# Patient Record
Sex: Male | Born: 1960
Health system: Southern US, Community
[De-identification: ages and names within clinical notes are randomized; demographics above are authoritative.]

## PROBLEM LIST (undated history)

## (undated) DIAGNOSIS — M19011 Primary osteoarthritis, right shoulder: Secondary | ICD-10-CM

## (undated) DIAGNOSIS — E785 Hyperlipidemia, unspecified: Secondary | ICD-10-CM

## (undated) DIAGNOSIS — R7303 Prediabetes: Secondary | ICD-10-CM

## (undated) DIAGNOSIS — F1010 Alcohol abuse, uncomplicated: Secondary | ICD-10-CM

## (undated) DIAGNOSIS — D509 Iron deficiency anemia, unspecified: Secondary | ICD-10-CM

## (undated) DIAGNOSIS — J189 Pneumonia, unspecified organism: Secondary | ICD-10-CM

## (undated) DIAGNOSIS — F4310 Post-traumatic stress disorder, unspecified: Secondary | ICD-10-CM

## (undated) DIAGNOSIS — K219 Gastro-esophageal reflux disease without esophagitis: Secondary | ICD-10-CM

## (undated) DIAGNOSIS — G4733 Obstructive sleep apnea (adult) (pediatric): Secondary | ICD-10-CM

## (undated) DIAGNOSIS — I10 Essential (primary) hypertension: Secondary | ICD-10-CM

## (undated) HISTORY — PX: APPENDECTOMY: SHX54

## (undated) HISTORY — PX: KNEE SURGERY: SHX244

## (undated) HISTORY — DX: Iron deficiency anemia, unspecified: D50.9

## (undated) HISTORY — DX: Obstructive sleep apnea (adult) (pediatric): G47.33

## (undated) HISTORY — DX: Alcohol abuse, uncomplicated: F10.10

## (undated) HISTORY — DX: Hyperlipidemia, unspecified: E78.5

## (undated) HISTORY — DX: Pneumonia, unspecified organism: J18.9

## (undated) HISTORY — DX: Primary osteoarthritis, right shoulder: M19.011

## (undated) HISTORY — DX: Prediabetes: R73.03

## (undated) HISTORY — PX: MENISCUS REPAIR: SHX5179

## (undated) HISTORY — DX: Post-traumatic stress disorder, unspecified: F43.10

---

## 2014-10-11 ENCOUNTER — Encounter: Payer: Self-pay | Admitting: *Deleted

## 2014-10-11 ENCOUNTER — Ambulatory Visit: Payer: BLUE CROSS/BLUE SHIELD

## 2014-10-11 ENCOUNTER — Ambulatory Visit
Admission: EM | Admit: 2014-10-11 | Discharge: 2014-10-11 | Disposition: A | Discharge status: Home / Self Care | Payer: BLUE CROSS/BLUE SHIELD | Attending: Family Medicine | Admitting: Family Medicine

## 2014-10-11 DIAGNOSIS — S62522A Displaced fracture of distal phalanx of left thumb, initial encounter for closed fracture: Secondary | ICD-10-CM | POA: Diagnosis not present

## 2014-10-11 HISTORY — DX: Gastro-esophageal reflux disease without esophagitis: K21.9

## 2014-10-11 HISTORY — DX: Essential (primary) hypertension: I10

## 2014-10-11 NOTE — ED Provider Notes (Signed)
CSN: [id number]     Arrival date & time 10/11/14  0825 History   First MD Initiated Contact with Patient 10/11/14 [phone]     Chief Complaint  Patient presents with  . Finger Injury    thumb on left hand   (Consider location/radiation/quality/duration/timing/severity/associated sxs/prior Treatment) HPI Comments: 54 yo male with left thumb pain after hitting his thumb by accident with a 2 lb hammer yesterday. Had some bleeding from the edges of the nail, but no lacerations or breaks in the skin.   The history is provided by the patient.    Past Medical History  Diagnosis Date  . Hypertension   . GERD (gastroesophageal reflux disease)    Past Surgical History  Procedure Laterality Date  . Appendectomy    . Knee surgery Bilateral    Family History  Problem Relation Age of Onset  . Diabetes Mother    Social History  Substance Use Topics  . Smoking status: Never Smoker   . Smokeless tobacco: Never Used  . Alcohol Use: Yes    Review of Systems  Allergies  Review of patient's allergies indicates no known allergies.  Home Medications   Prior to Admission medications   Medication Sig Start Date End Date Taking? Authorizing Provider  enalapril (VASOTEC) 10 MG tablet Take 10 mg by mouth daily.   Yes Historical Provider, MD  hydrochlorothiazide (HYDRODIURIL) 25 MG tablet Take 25 mg by mouth daily.   Yes Historical Provider, MD  omeprazole (PRILOSEC) 40 MG capsule Take 40 mg by mouth daily.   Yes Historical Provider, MD   Meds Ordered and Administered this Visit  Medications - No data to display  BP 154/107 mmHg  Temp(Src) 98.3 F (36.8 C) (Oral)  Resp 18  Ht  (1.753 m)  Wt 210 lb (95.255 kg)  BMI 31.00 kg/m2  SpO2 97% No data found.   Physical Exam  Constitutional: He appears well-developed and well-nourished. No distress.  Musculoskeletal:       Left hand: He exhibits tenderness and swelling. He exhibits normal two-point discrimination, normal capillary refill  and no deformity. Normal sensation noted. Normal strength noted.  Left thumb swelling and  tenderness to palpation over distal phalanx area; mild amount of blood present at the edges of the nail  Neurological: He is alert.  Skin: He is not diaphoretic.  Nursing note and vitals reviewed.   ED Course  Procedures (including critical care time)  Labs Review Labs Reviewed - No data to display  Imaging Review Dg Finger Thumb Left  10/11/2014   CLINICAL DATA:  Patient hit thumb with hammer  EXAM: LEFT THUMB 2+V  COMPARISON:  None.  FINDINGS: Frontal, oblique, and lateral views were obtained. There is a transversely oriented fracture of the distal aspect of the first distal phalanx with slight displacement of fracture fragments. No other fractures. No dislocation. Joint spaces appear intact. No erosive change.  IMPRESSION: Transversely oriented fracture distal aspect first distal phalanx with mild displacement of fracture fragments.   Electronically Signed   By: Bretta Bang III M.D.   On: 10/11/2014 09:34     Visual Acuity Review  Right Eye Distance:   Left Eye Distance:   Bilateral Distance:    Right Eye Near:   Left Eye Near:    Bilateral Near:         MDM   1. Fracture of distal phalanx of left thumb, closed, initial encounter    Plan: 1. x-ray results and diagnosis reviewed with  patient/parent/guardian/caregiver 2. rx as per orders above;  benefits, risks, potential side effects reviewed; patient given handwritten rx for vicodin 3/325, 1-2 tabs po q 8hrs prn, #10 3. Thumb splint applied for immobilization 4. Recommend supportive treatment with elevation of hand, otc analgesics/NSAIDS prn 5. Follow up with hand orthopedist specialist in 1-2 days for further evaluation and management 6. F/U here prn   Payton Mccallum, MD 10/11/14 1034

## 2014-10-11 NOTE — ED Notes (Signed)
Patient hit his left thumb with a 2 lb hammer yesterday at 1300. The nail is intact with bleeding coming from around the edges of the nail. No breaks in the skin visible. Swelling is present.

## 2014-10-11 NOTE — Discharge Instructions (Signed)
Thumb Fracture  °There are many types of thumb fractures (breaks). There are different ways of treating these fractures, all of which may be correct, varying from case to case. Your caregiver will discuss different ways to treat these fractures with you. °TREATMENT  °· Immobilization. This means the fracture is casted as it is without changing the positions of the fracture (bone pieces) involved. This fracture is casted in a "thumb spica" also called a hitchhiker cast. It is generally left on for 2 to 6 weeks. °· Closed reduction. The bones are manipulated back into position without using surgery. °· ORIF (open reduction and internal fixation). The fracture site is opened and the bone pieces are fixed into place with some type of hardware such as screws or wires. °Your caregiver will discuss the type of fracture you have and the treatment that will be best for that problem. If surgery is the treatment of choice, the following is information for you to know and to let your caregiver know about prior to surgery. °LET YOUR CAREGIVERS KNOW ABOUT: °· Allergies. °· Medications taken including herbs, eye drops, over the counter medications, and creams. °· Use of steroids (by mouth or creams). °· Previous problems with anesthetics or Novocain. °· Family history of anesthetic complications.. °· Possibility of pregnancy, if this applies. °· History of blood clots (thrombophlebitis). °· History of bleeding or blood problems. °· Previous surgery. °· Other health problems. °AFTER THE PROCEDURE  °After surgery, you will be taken to the recovery area. A nurse will watch and check your progress. Once you are awake, stable, and taking fluids well, barring other problems you will be allowed to go home. Once home, an ice pack applied to your operative site may help with discomfort and keep the swelling down. Elevate your hand above your heart as much as possible for the first 4-5 days after the injury/surgery. °HOME CARE INSTRUCTIONS    °· Follow your caregiver's instructions as to activities, exercises, physical therapy, and driving a car. °· Use thumb and exercise as directed. °· Only take over-the-counter or prescription medicines for pain, discomfort, or fever as directed by your caregiver. Do not take aspirin until your caregiver instructs. This can increase bleeding immediately following surgery. °SEEK MEDICAL CARE IF:  °· There is increased bleeding (more than a small spot) from the wound or from beneath your cast or splint. °· There is redness, swelling, or increasing pain in the wound or from beneath your cast or splint. °· You have pus coming from wound or from beneath your cast or splint. °· An unexplained oral temperature above 102° F (38.9° C) develops. °· There is a foul smell coming from the wound or dressing or from beneath your cast or splint. °SEEK IMMEDIATE MEDICAL CARE IF:  °· You develop severe pain, decreased sensation such as numbness or tingling. °· You develop a rash. °· You have difficulty breathing. °· Youhave any allergic problems. °If you do not have a window in your cast for observing the wound, a discharge or minor bleeding may show up as a stain on the outside of your cast. Report these findings to your caregiver. If you have a removable splint overlying the surgical dressings it is common to see a small amount of bleeding. Change the dressings as instructed by your caregiver. °Document Released: 09/24/2002 Document Revised: 03/20/2011 Document Reviewed: 02/28/2013 °Exit[employer]® Patient Information ©2015 [employer]. This information is not intended to replace advice given to you by your health care provider. Make sure   you discuss any questions you have with your health care provider. ° °

## 2014-12-15 ENCOUNTER — Ambulatory Visit
Admission: EM | Admit: 2014-12-15 | Discharge: 2014-12-15 | Disposition: A | Discharge status: Home / Self Care | Payer: 59 | Attending: Family Medicine | Admitting: Family Medicine

## 2014-12-15 ENCOUNTER — Encounter: Payer: Self-pay | Admitting: *Deleted

## 2014-12-15 DIAGNOSIS — J40 Bronchitis, not specified as acute or chronic: Secondary | ICD-10-CM

## 2014-12-15 MED ORDER — AZITHROMYCIN 250 MG PO TABS: ORAL_TABLET | ORAL | Status: DC

## 2014-12-15 MED ORDER — HYDROCOD POLST-CPM POLST ER 10-8 MG/5ML PO SUER: 5.0000 mL | Freq: Two times a day (BID) | ORAL | Status: DC

## 2014-12-15 NOTE — ED Notes (Signed)
Patient started having nasal congestion 3 weeks ago that has progressed into a cough and some chest congestion. Nasal congestion is tan brown in color and fever symptoms have occurred.

## 2014-12-15 NOTE — Discharge Instructions (Signed)
Upper Respiratory Infection, Adult Most upper respiratory infections (URIs) are a viral infection of the air passages leading to the lungs. A URI affects the nose, throat, and upper air passages. The most common type of URI is nasopharyngitis and is typically referred to as "the common cold." URIs run their course and usually go away on their own. Most of the time, a URI does not require medical attention, but sometimes a bacterial infection in the upper airways can follow a viral infection. This is called a secondary infection. Sinus and middle ear infections are common types of secondary upper respiratory infections. Bacterial pneumonia can also complicate a URI. A URI can worsen asthma and chronic obstructive pulmonary disease (COPD). Sometimes, these complications can require emergency medical care and may be life threatening.  CAUSES Almost all URIs are caused by viruses. A virus is a type of germ and can spread from one person to another.  RISKS FACTORS You may be at risk for a URI if:   You smoke.   You have chronic heart or lung disease.  You have a weakened defense (immune) system.   You are very young or very old.   You have nasal allergies or asthma.  You work in crowded or poorly ventilated areas.  You work in health care facilities or schools. SIGNS AND SYMPTOMS  Symptoms typically develop 2-3 days after you come in contact with a cold virus. Most viral URIs last 7-10 days. However, viral URIs from the influenza virus (flu virus) can last 14-18 days and are typically more severe. Symptoms may include:   Runny or stuffy (congested) nose.   Sneezing.   Cough.   Sore throat.   Headache.   Fatigue.   Fever.   Loss of appetite.   Pain in your forehead, behind your eyes, and over your cheekbones (sinus pain).  Muscle aches.  DIAGNOSIS  Your health care provider may diagnose a URI by:  Physical exam.  Tests to check that your symptoms are not due to  another condition such as:  Strep throat.  Sinusitis.  Pneumonia.  Asthma. TREATMENT  A URI goes away on its own with time. It cannot be cured with medicines, but medicines may be prescribed or recommended to relieve symptoms. Medicines may help:  Reduce your fever.  Reduce your cough.  Relieve nasal congestion. HOME CARE INSTRUCTIONS   Take medicines only as directed by your health care provider.   Gargle warm saltwater or take cough drops to comfort your throat as directed by your health care provider.  Use a warm mist humidifier or inhale steam from a shower to increase air moisture. This may make it easier to breathe.  Drink enough fluid to keep your urine clear or pale yellow.   Eat soups and other clear broths and maintain good nutrition.   Rest as needed.   Return to work when your temperature has returned to normal or as your health care provider advises. You may need to stay home longer to avoid infecting others. You can also use a face mask and careful hand washing to prevent spread of the virus.  Increase the usage of your inhaler if you have asthma.   Do not use any tobacco products, including cigarettes, chewing tobacco, or electronic cigarettes. If you need help quitting, ask your health care provider. PREVENTION  The best way to protect yourself from getting a cold is to practice good hygiene.   Avoid oral or hand contact with people with cold   symptoms.   Wash your hands often if contact occurs.  There is no clear evidence that vitamin C, vitamin E, echinacea, or exercise reduces the chance of developing a cold. However, it is always recommended to get plenty of rest, exercise, and practice good nutrition.  SEEK MEDICAL CARE IF:   You are getting worse rather than better.   Your symptoms are not controlled by medicine.   You have chills.  You have worsening shortness of breath.  You have brown or red mucus.  You have yellow or brown nasal  discharge.  You have pain in your face, especially when you bend forward.  You have a fever.  You have swollen neck glands.  You have pain while swallowing.  You have white areas in the back of your throat. SEEK IMMEDIATE MEDICAL CARE IF:   You have severe or persistent:  Headache.  Ear pain.  Sinus pain.  Chest pain.  You have chronic lung disease and any of the following:  Wheezing.  Prolonged cough.  Coughing up blood.  A change in your usual mucus.  You have a stiff neck.  You have changes in your:  Vision.  Hearing.  Thinking.  Mood. MAKE SURE YOU:   Understand these instructions.  Will watch your condition.  Will get help right away if you are not doing well or get worse.   This information is not intended to replace advice given to you by your health care provider. Make sure you discuss any questions you have with your health care provider.   Document Released: 06/21/2000 Document Revised: 05/12/2014 Document Reviewed: 04/02/2013 Elsevier Interactive Patient Education 2016 [employer]  

## 2014-12-15 NOTE — ED Provider Notes (Signed)
CSN: [id number]     Arrival date & time 12/15/14  1012 History   First MD Initiated Contact with Patient 12/15/14 1216     Chief Complaint  Patient presents with  . Nasal Congestion  . Sore Throat   (Consider location/radiation/quality/duration/timing/severity/associated sxs/prior Treatment) HPI   This a 54 year old gentleman presents with a three-week history of nasal congestion initially but has now progressed into a cough productive of tan type sputum chest congestion and feeling feverish although he is afebrile now. He states he is coughing incessantly during the night keeping his family awake. He states that his throat is sore from the amount of coughing that he is doing. He has not missed any work but did not feel good enough to go to work today. He states that there are times when seem like it's improving but always seems to return. He denies any shortness of breath and has had a 99% O2 sat today on room air.  Past Medical History  Diagnosis Date  . Hypertension   . GERD (gastroesophageal reflux disease)    Past Surgical History  Procedure Laterality Date  . Appendectomy    . Knee surgery Bilateral    Family History  Problem Relation Age of Onset  . Diabetes Mother    Social History  Substance Use Topics  . Smoking status: Never Smoker   . Smokeless tobacco: Never Used  . Alcohol Use: Yes    Review of Systems  Constitutional: Positive for fever, chills and fatigue.  HENT: Positive for congestion, postnasal drip, rhinorrhea, sinus pressure, sneezing and sore throat.   Respiratory: Positive for cough.   All other systems reviewed and are negative.   Allergies  Review of patient's allergies indicates no known allergies.  Home Medications   Prior to Admission medications   Medication Sig Start Date End Date Taking? Authorizing Provider  amLODipine (NORVASC) 5 MG tablet Take 5 mg by mouth daily.   Yes Historical Provider, MD  hydrochlorothiazide (HYDRODIURIL) 25 MG  tablet Take 25 mg by mouth daily.   Yes Historical Provider, MD  omeprazole (PRILOSEC) 40 MG capsule Take 40 mg by mouth daily.   Yes Historical Provider, MD  azithromycin (ZITHROMAX Z-PAK) 250 MG tablet Use as per package instructions 12/15/14   Lutricia Feil, PA-C  chlorpheniramine-HYDROcodone [facility]) 10-8 MG/5ML SUER Take 5 mLs by mouth 2 (two) times daily. 12/15/14   Lutricia Feil, PA-C  enalapril (VASOTEC) 10 MG tablet Take 10 mg by mouth daily.    Historical Provider, MD   Meds Ordered and Administered this Visit  Medications - No data to display  BP 122/81 mmHg  Pulse 82  Temp(Src) 98 F (36.7 C) (Oral)  Resp 20  Ht  (1.727 m)  Wt 210 lb (95.255 kg)  BMI 31.94 kg/m2  SpO2 99% No data found.   Physical Exam  Constitutional: He is oriented to person, place, and time. He appears well-developed and well-nourished. No distress.  HENT:  Head: Normocephalic and atraumatic.  Left Ear: External ear normal.  Nose: Nose normal.  Mouth/Throat: Oropharynx is clear and moist. No oropharyngeal exudate.  Right ear is occluded with cerumen. He has no tenderness over the sinus cavities.  Eyes: Conjunctivae are normal. Pupils are equal, round, and reactive to light. Right eye exhibits no discharge. Left eye exhibits no discharge.  Neck: Neck supple. No thyromegaly present.  Pulmonary/Chest: Effort normal and breath sounds normal. No respiratory distress. He has no wheezes. He has no  rales.  Musculoskeletal: Normal range of motion. He exhibits no edema or tenderness.  Lymphadenopathy:    He has no cervical adenopathy.  Neurological: He is alert and oriented to person, place, and time.  Skin: Skin is warm and dry. He is not diaphoretic.  Psychiatric: He has a normal mood and affect. His behavior is normal. Judgment and thought content normal.    ED Course  Procedures (including critical care time)  Labs Review Labs Reviewed - No data to display  Imaging  Review No results found.   Visual Acuity Review  Right Eye Distance:   Left Eye Distance:   Bilateral Distance:    Right Eye Near:   Left Eye Near:    Bilateral Near:         MDM   1. Bronchitis    Discharge Medication List as of 12/15/2014 12:58 PM    START taking these medications   Details  azithromycin (ZITHROMAX Z-PAK) 250 MG tablet Use as per package instructions, Print    chlorpheniramine-HYDROcodone (TUSSIONEX PENNKINETIC ER) 10-8 MG/5ML SUER Take 5 mLs by mouth 2 (two) times daily., Starting 12/15/2014, Until Discontinued, Print      Plan: 1.Diagnosis reviewed with patient 2. rx as per orders; risks, benefits, potential side effects reviewed with patient 3. Recommend supportive treatment with rest and fluids. 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 He may take Motrin for pain. 4. F/u prn if symptoms worsen or don't improve     Lutricia FeilWilliam P Rakisha Pincock, PA-C 12/15/14 1305

## 2015-05-17 DIAGNOSIS — K080 Exfoliation of teeth due to systemic causes: Secondary | ICD-10-CM | POA: Diagnosis not present

## 2016-02-25 ENCOUNTER — Emergency Department (HOSPITAL_COMMUNITY)
Admission: EM | Admit: 2016-02-25 | Discharge: 2016-02-25 | Disposition: A | Discharge status: Home / Self Care | Payer: Federal, State, Local not specified - PPO | Attending: Emergency Medicine | Admitting: Emergency Medicine

## 2016-02-25 ENCOUNTER — Encounter (HOSPITAL_COMMUNITY): Payer: Self-pay

## 2016-02-25 ENCOUNTER — Emergency Department (HOSPITAL_COMMUNITY): Payer: Federal, State, Local not specified - PPO

## 2016-02-25 DIAGNOSIS — M19011 Primary osteoarthritis, right shoulder: Secondary | ICD-10-CM | POA: Insufficient documentation

## 2016-02-25 DIAGNOSIS — M1990 Unspecified osteoarthritis, unspecified site: Secondary | ICD-10-CM | POA: Diagnosis not present

## 2016-02-25 DIAGNOSIS — I10 Essential (primary) hypertension: Secondary | ICD-10-CM | POA: Insufficient documentation

## 2016-02-25 DIAGNOSIS — Z79899 Other long term (current) drug therapy: Secondary | ICD-10-CM | POA: Diagnosis not present

## 2016-02-25 DIAGNOSIS — M25511 Pain in right shoulder: Secondary | ICD-10-CM | POA: Diagnosis not present

## 2016-02-25 DIAGNOSIS — M19019 Primary osteoarthritis, unspecified shoulder: Secondary | ICD-10-CM

## 2016-02-25 MED ORDER — IBUPROFEN 600 MG PO TABS: 600.0000 mg | ORAL_TABLET | Freq: Four times a day (QID) | ORAL | 0 refills | Status: DC | PRN

## 2016-02-25 NOTE — ED Triage Notes (Signed)
Pt states that he has been having R shoulder pain since yesterday worse today, denies injury, no deformity noted.

## 2016-02-25 NOTE — ED Provider Notes (Signed)
[facility]-EMERGENCY DEPT Provider Note   CSN: [id number] Arrival date & time: 02/25/16  0110  By signing my name below, I, Todd Payne, attest that this documentation has been prepared under the direction and in the presence of Todd Rhine, MD.  Electronically Signed: Octavia Payne, ED Scribe. 02/25/16. 4:03 AM.    History   Chief Complaint Chief Complaint  Patient presents with  . Shoulder Pain    The history is provided by the patient. No language interpreter was used.  Shoulder Pain   This is a new problem. The current episode started 6 to 12 hours ago. The problem occurs rarely. The problem has been gradually worsening. The pain is present in the right shoulder. The quality of the pain is described as intermittent. The pain is mild. Pertinent negatives include no numbness. He has tried OTC pain medications for the symptoms. There has been a history of trauma.   HPI Comments: Todd Payne is a 56 y.o. male who presents to the Emergency Department complaining of progressively worsening right shoulder pain x yesterday. Pt describes his pain as a sore sensation. He notes having slight pain while going to work but says it alleviated minimally. Pt says he was playing with his family this evening and says that he began to have extreme discomfort. He notes having a dislocated right collar bone about 2 years ago and has been having problems since. 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 He has taken tylenol and oxycodone to alleviate his pain without relief. Pt denies fever, vomiting, chest pain, back pain, abdominal pain, and weakness in arms or legs.  Past Medical History:  Diagnosis Date  . GERD (gastroesophageal reflux disease)   . Hypertension     There are no active problems to display for this patient.   Past Surgical History:  Procedure Laterality Date  . APPENDECTOMY    . KNEE SURGERY Bilateral          Home Medications    Prior to Admission medications   Medication Sig Start Date End Date Taking? Authorizing Provider  amLODipine (NORVASC) 5 MG tablet Take 5 mg by mouth daily.    Historical Provider, MD  azithromycin (ZITHROMAX Z-PAK) 250 MG tablet Use as per package instructions 12/15/14   Lutricia Feil, PA-C  chlorpheniramine-HYDROcodone [facility]) 10-8 MG/5ML SUER Take 5 mLs by mouth 2 (two) times daily. 12/15/14   Lutricia Feil, PA-C  enalapril (VASOTEC) 10 MG tablet Take 10 mg by mouth daily.    Historical Provider, MD  hydrochlorothiazide (HYDRODIURIL) 25 MG tablet Take 25 mg by mouth daily.    Historical Provider, MD  omeprazole (PRILOSEC) 40 MG capsule Take 40 mg by mouth daily.    Historical Provider, MD    Family History Family History  Problem Relation Age of Onset  . Diabetes Mother     Social History Social History  Substance Use Topics  . Smoking status: Never Smoker  . Smokeless tobacco: Never Used  . Alcohol use Yes     Allergies   Patient has no known allergies.   Review of Systems Review of Systems  Constitutional: Negative for fever.  Respiratory: Negative for shortness of breath.   Cardiovascular: Negative for chest pain.  Gastrointestinal: Negative for abdominal pain, nausea and vomiting.  Musculoskeletal: Positive for arthralgias. Negative for back pain.  Neurological: Negative for weakness and numbness.     Physical Exam  Updated Vital Signs BP (!) 140/109   Pulse 66   Temp 98.5 F (36.9 C) (Oral)   Resp 18   SpO2 98%   Physical Exam  CONSTITUTIONAL: Well developed/well nourished HEAD: Normocephalic/atraumatic EYES: EOMI/PERRL ENMT: Mucous membranes moist NECK: supple no meningeal signs SPINE/BACK:entire spine nontender CV: S1/S2 noted, no murmurs/rubs/gallops noted LUNGS: Lungs are clear to auscultation bilaterally, no apparent distress NEURO: Pt is awake/alert/appropriate, moves all extremitiesx4.   No facial droop.   EXTREMITIES: pulses normal/equal, full ROM; pt is able to range right shoulder but it is limited to pain, no deformity, no bruising noted. No warmth/erythema noted SKIN: warm, color normal PSYCH: no abnormalities of mood noted, alert and oriented to situation   .ED Treatments / Results  DIAGNOSTIC STUDIES: Oxygen Saturation is 98% on RA, normal by my interpretation.  COORDINATION OF CARE:  4:01 AM Discussed treatment plan with pt at bedside and pt agreed to plan.  Labs (all labs ordered are listed, but only abnormal results are displayed) Labs Reviewed - No data to display  EKG  EKG Interpretation None       Radiology Dg Shoulder Right  Result Date: 02/25/2016 CLINICAL DATA:  Right shoulder pain since yesterday. No known injury. History of AC joint dislocation a few years ago. EXAM: RIGHT SHOULDER - 2+ VIEW COMPARISON:  None. FINDINGS: There is osteoarthritis of the glenohumeral joint with inferomedial spurring off the humeral head and inferior glenoid rim. Well corticated ossification is seen in the axillary pouch possibly an intra-articular loose body or subscapularis tendinopathy. AC joint is maintained. The adjacent ribs and lung are unremarkable. IMPRESSION: Osteoarthritis of the glenohumeral joint with spurring and possible axillary pouch loose body versus subscapularis calcific tendinopathy. Electronically Signed   By: Tollie Ethavid  Kwon M.D.   On: 02/25/2016 01:52    Procedures Procedures  SPLINT APPLICATION Date/Time: 02/25/16 Authorized by: Joya GaskinsWICKLINE,Sarinity Dicicco W Consent: Verbal consent obtained. Risks and benefits: risks, benefits and alternatives were discussed Consent given by: patient Splint applied by: nurse Location details: right arm Splint type: sling Supplies used: sling Post-procedure: The splinted body part was unchanged following the procedure. Patient tolerance: Patient tolerated the procedure well with no immediate  complications.     Medications Ordered in ED Medications - No data to display   Initial Impression / Assessment and Plan / ED Course  I have reviewed the triage vital signs and the nursing notes.   Referred to ortho Advised to range arm each day to prevent frozen shoulder   Final Clinical Impressions(s) / ED Diagnoses   Final diagnoses:  Arthritis, shoulder region   I personally performed the services described in this documentation, which was scribed in my presence. The recorded information has been reviewed and is accurate.     New Prescriptions Discharge Medication List as of 02/25/2016  4:26 AM       Todd Rhineonald Yesena Reaves, MD 02/25/16 [phone]

## 2016-03-01 DIAGNOSIS — M19011 Primary osteoarthritis, right shoulder: Secondary | ICD-10-CM | POA: Diagnosis not present

## 2016-03-03 DIAGNOSIS — I10 Essential (primary) hypertension: Secondary | ICD-10-CM | POA: Diagnosis not present

## 2016-08-09 ENCOUNTER — Ambulatory Visit (HOSPITAL_COMMUNITY)
Admission: EM | Admit: 2016-08-09 | Discharge: 2016-08-09 | Disposition: A | Discharge status: Home / Self Care | Payer: Federal, State, Local not specified - PPO | Attending: Family Medicine | Admitting: Family Medicine

## 2016-08-09 ENCOUNTER — Encounter (HOSPITAL_COMMUNITY): Payer: Self-pay | Admitting: Emergency Medicine

## 2016-08-09 DIAGNOSIS — J069 Acute upper respiratory infection, unspecified: Secondary | ICD-10-CM

## 2016-08-09 DIAGNOSIS — J189 Pneumonia, unspecified organism: Secondary | ICD-10-CM

## 2016-08-09 HISTORY — DX: Pneumonia, unspecified organism: J18.9

## 2016-08-09 MED ORDER — CETIRIZINE-PSEUDOEPHEDRINE ER 5-120 MG PO TB12: 1.0000 | ORAL_TABLET | Freq: Every day | ORAL | 0 refills | Status: DC

## 2016-08-09 MED ORDER — IBUPROFEN 800 MG PO TABS
ORAL_TABLET | ORAL | Status: AC
Start: 2016-08-09 — End: 2016-08-09
  Filled 2016-08-09: qty 1

## 2016-08-09 MED ORDER — HYDROCOD POLST-CPM POLST ER 10-8 MG/5ML PO SUER: 5.0000 mL | Freq: Two times a day (BID) | ORAL | 0 refills | Status: DC | PRN

## 2016-08-09 MED ORDER — IBUPROFEN 800 MG PO TABS
800.0000 mg | ORAL_TABLET | Freq: Once | ORAL | Status: AC
  Administered 2016-08-09: 800 mg via ORAL

## 2016-08-09 MED ORDER — BENZONATATE 100 MG PO CAPS: 100.0000 mg | ORAL_CAPSULE | Freq: Three times a day (TID) | ORAL | 0 refills | Status: DC

## 2016-08-09 MED ORDER — FLUTICASONE PROPIONATE 50 MCG/ACT NA SUSP: 2.0000 | Freq: Every day | NASAL | 0 refills | Status: DC

## 2016-08-09 NOTE — ED Provider Notes (Signed)
CSN: [id number]     Arrival date & time 08/09/16  1231 History   None    Chief Complaint  Patient presents with  . Fever  . Nasal Congestion   (Consider location/radiation/quality/duration/timing/severity/associated sxs/prior Treatment) 56 year old male with history of hypertension, GERD, comes in for 5 day history of fever, cough, congestion. Tmax 102. Cough is nonproductive, and is worse when talking. Denies ear pain, eye pain, shortness of breath, wheezing, chest pain. Reports some frontal headache when coughing. Has had some diarrhea, denies abdominal pain, nausea, vomiting. He's been taking naproxen for the fever. Has not taken anything else.      Past Medical History:  Diagnosis Date  . GERD (gastroesophageal reflux disease)   . Hypertension    Past Surgical History:  Procedure Laterality Date  . APPENDECTOMY    . KNEE SURGERY Bilateral    Family History  Problem Relation Age of Onset  . Diabetes Mother    Social History  Substance Use Topics  . Smoking status: Never Smoker  . Smokeless tobacco: Never Used  . Alcohol use Yes    Review of Systems  Reason unable to perform ROS: See HPI as above.    Allergies  Patient has no known allergies.  Home Medications   Prior to Admission medications   Medication Sig Start Date End Date Taking? Authorizing Provider  amLODipine (NORVASC) 5 MG tablet Take 5 mg by mouth daily.    [provider]  azithromycin (ZITHROMAX Z-PAK) 250 MG tablet Use as per package instructions 12/15/14   Lutricia Feiloemer, William P, PA-C  benzonatate (TESSALON) 100 MG capsule Take 1 capsule (100 mg total) by mouth every 8 (eight) hours. 08/09/16   Cathie HoopsYu, Arvel Oquinn V, PA-C  cetirizine-pseudoephedrine (ZYRTEC-D) 5-120 MG tablet Take 1 tablet by mouth daily. 08/09/16   Cathie HoopsYu, Rashawn Rolon V, PA-C  chlorpheniramine-HYDROcodone (TUSSIONEX PENNKINETIC ER) 10-8 MG/5ML SUER Take 5 mLs by mouth every 12 (twelve) hours as needed for cough. 08/09/16   Cathie HoopsYu, Noelly Lasseigne V, PA-C  enalapril  (VASOTEC) 10 MG tablet Take 10 mg by mouth daily.    [provider]  fluticasone (FLONASE) 50 MCG/ACT nasal spray Place 2 sprays into both nostrils daily. 08/09/16   Cathie HoopsYu, Jayland Null V, PA-C  hydrochlorothiazide (HYDRODIURIL) 25 MG tablet Take 25 mg by mouth daily.    [provider]  ibuprofen (ADVIL,MOTRIN) 600 MG tablet Take 1 tablet (600 mg total) by mouth every 6 (six) hours as needed. 02/25/16   Zadie RhineWickline, Donald, MD  omeprazole (PRILOSEC) 40 MG capsule Take 40 mg by mouth daily.    [provider]   Meds Ordered and Administered this Visit   Medications  ibuprofen (ADVIL,MOTRIN) tablet 800 mg (800 mg Oral Given 08/09/16 1321)    BP (!) 137/95   Pulse 98   Temp (!) 102.1 F (38.9 C) (Oral)   Resp 16   Ht 5\' 9"  (1.753 m)   Wt 210 lb (95.3 kg)   SpO2 97%   BMI 31.01 kg/m  No data found.   Physical Exam  Constitutional: He is oriented to person, place, and time. He appears well-developed and well-nourished. No distress.  HENT:  Head: Normocephalic and atraumatic.  Right Ear: Tympanic membrane, external ear and ear canal normal. Tympanic membrane is not erythematous and not bulging.  Left Ear: Tympanic membrane, external ear and ear canal normal. Tympanic membrane is not erythematous and not bulging.  Nose: Nose normal. Right sinus exhibits no maxillary sinus tenderness and no frontal sinus tenderness. Left sinus  exhibits no maxillary sinus tenderness and no frontal sinus tenderness.  Mouth/Throat: Uvula is midline, oropharynx is clear and moist and mucous membranes are normal.  Eyes: Pupils are equal, round, and reactive to light. Conjunctivae are normal.  Neck: Normal range of motion. Neck supple.  Cardiovascular: Normal rate, regular rhythm and normal heart sounds.  Exam reveals no gallop and no friction rub.   No murmur heard. Pulmonary/Chest: Effort normal.  Continued coughing throughout exam. Mild crackles heard in the periphery bilaterally, which resolves  after cough.   Lymphadenopathy:    He has no cervical adenopathy.  Neurological: He is alert and oriented to person, place, and time.  Skin: Skin is warm and dry.  Psychiatric: He has a normal mood and affect. His behavior is normal. Judgment normal.    Urgent Care Course     Procedures (including critical care time)  Labs Review Labs Reviewed - No data to display  Imaging Review No results found.     MDM   1. Viral upper respiratory tract infection    Discussed with patient mild crackles heard on exam today, which resolves with cough. Given patient onset 5 days ago, O2 sat 97% at room air, without shortness of breath, and productive cough, will defer chest x-ray as of right now. Start symptomatic treatment of Tussionex, Flonase, decongestants. Patient to continue Tylenol/Motrin for fever. Push fluids. Monitor for worsening of symptoms, follow up for reevaluation.   Belinda FisherYu, Lamiah Marmol V, PA-C 08/09/16 1415

## 2016-08-09 NOTE — Discharge Instructions (Signed)
Start medications as directed for your symptoms. Stay hydrated, your urine should be a clear/pale-yellow color. Take tylenol/motrin for pain and fever. Monitor for worsening of symptoms, chest pain, shortness of breath, wheezing, to follow up for reevaluation.

## 2016-08-09 NOTE — ED Triage Notes (Signed)
PT reports fever, cough, congestion since Saturday.

## 2016-08-11 ENCOUNTER — Encounter (HOSPITAL_COMMUNITY): Payer: Self-pay | Admitting: Emergency Medicine

## 2016-08-11 ENCOUNTER — Ambulatory Visit (HOSPITAL_COMMUNITY)
Admission: EM | Admit: 2016-08-11 | Discharge: 2016-08-11 | Disposition: A | Discharge status: Home / Self Care | Payer: Federal, State, Local not specified - PPO | Attending: Emergency Medicine | Admitting: Emergency Medicine

## 2016-08-11 ENCOUNTER — Ambulatory Visit (INDEPENDENT_AMBULATORY_CARE_PROVIDER_SITE_OTHER): Payer: Federal, State, Local not specified - PPO

## 2016-08-11 DIAGNOSIS — J181 Lobar pneumonia, unspecified organism: Secondary | ICD-10-CM

## 2016-08-11 DIAGNOSIS — R05 Cough: Secondary | ICD-10-CM | POA: Diagnosis not present

## 2016-08-11 DIAGNOSIS — J189 Pneumonia, unspecified organism: Secondary | ICD-10-CM

## 2016-08-11 MED ORDER — CETIRIZINE HCL 10 MG PO TABS: 10.0000 mg | ORAL_TABLET | Freq: Every day | ORAL | 0 refills | Status: DC

## 2016-08-11 MED ORDER — AZITHROMYCIN 500 MG PO TABS: 500.0000 mg | ORAL_TABLET | Freq: Every day | ORAL | 0 refills | Status: AC

## 2016-08-11 MED ORDER — ACETAMINOPHEN 325 MG PO TABS
650.0000 mg | ORAL_TABLET | Freq: Once | ORAL | Status: AC
  Administered 2016-08-11: 650 mg via ORAL

## 2016-08-11 MED ORDER — ACETAMINOPHEN 325 MG PO TABS
ORAL_TABLET | ORAL | Status: AC
Start: 2016-08-11 — End: 2016-08-11
  Filled 2016-08-11: qty 2

## 2016-08-11 NOTE — Discharge Instructions (Signed)
Take antibiotics as directed for 5 days. Take Zyrtec for urticaria. Take Tylenol/Motrin for fever. Continue to keep hydrated. Monitor for worsening of symptoms, trouble breathing, trouble swallowing, to follow up at the emergency room for further evaluation. Follow up here or with PCP for repeat chest x-ray in 3-4 weeks per radiologist's suggestion.

## 2016-08-11 NOTE — ED Triage Notes (Signed)
PT has URI symptoms, cough, headache, and fever that started Saturday. PT was seen here Wednesday for same.

## 2016-08-11 NOTE — ED Provider Notes (Signed)
CSN: [id number]     Arrival date & time 08/11/16  1116 History   None    Chief Complaint  Patient presents with  . URI  . Shortness of Breath   (Consider location/radiation/quality/duration/timing/severity/associated sxs/prior Treatment) 56 year old male with PMH of hypertension, GERD comes in for continued fever and worsening cough/shortness of breath since being seen 2 days ago. He has now had the symptoms for about 7 days. He's been taking Flonase, decongestants as directed with some relief. Patient states Tessionex "working too well", and has been monitoring intake for that. He continues to take antipyretics as directed. Still has front sinus pressure which has not resolved. Denies wheezing, shortness of breath without cough, chest pain. Denies sore throat. Denies abdominal pain, nausea, vomiting, diarrhea. Denies urinary symptoms such as frequency, dysuria, hematuria.      Past Medical History:  Diagnosis Date  . GERD (gastroesophageal reflux disease)   . Hypertension    Past Surgical History:  Procedure Laterality Date  . APPENDECTOMY    . KNEE SURGERY Bilateral    Family History  Problem Relation Age of Onset  . Diabetes Mother    Social History  Substance Use Topics  . Smoking status: Never Smoker  . Smokeless tobacco: Never Used  . Alcohol use Yes    Review of Systems  Constitutional: Positive for fatigue and fever. Negative for chills and diaphoresis.  Respiratory: Positive for cough and shortness of breath. Negative for wheezing.     Allergies  Patient has no known allergies.  Home Medications   Prior to Admission medications   Medication Sig Start Date End Date Taking? Authorizing Provider  amLODipine (NORVASC) 5 MG tablet Take 5 mg by mouth daily.    [provider]  azithromycin (ZITHROMAX) 500 MG tablet Take 1 tablet (500 mg total) by mouth daily. Take first 2 tablets together, then 1 every day until finished. 08/11/16 08/16/16  Cathie HoopsYu, Amy V, PA-C   benzonatate (TESSALON) 100 MG capsule Take 1 capsule (100 mg total) by mouth every 8 (eight) hours. 08/09/16   Cathie HoopsYu, Amy V, PA-C  cetirizine (ZYRTEC ALLERGY) 10 MG tablet Take 1 tablet (10 mg total) by mouth daily. 08/11/16 08/26/16  Cathie HoopsYu, Amy V, PA-C  cetirizine-pseudoephedrine (ZYRTEC-D) 5-120 MG tablet Take 1 tablet by mouth daily. 08/09/16   Cathie HoopsYu, Amy V, PA-C  chlorpheniramine-HYDROcodone (TUSSIONEX PENNKINETIC ER) 10-8 MG/5ML SUER Take 5 mLs by mouth every 12 (twelve) hours as needed for cough. 08/09/16   Cathie HoopsYu, Amy V, PA-C  enalapril (VASOTEC) 10 MG tablet Take 10 mg by mouth daily.    [provider]  fluticasone (FLONASE) 50 MCG/ACT nasal spray Place 2 sprays into both nostrils daily. 08/09/16   Cathie HoopsYu, Amy V, PA-C  hydrochlorothiazide (HYDRODIURIL) 25 MG tablet Take 25 mg by mouth daily.    [provider]  ibuprofen (ADVIL,MOTRIN) 600 MG tablet Take 1 tablet (600 mg total) by mouth every 6 (six) hours as needed. 02/25/16   Zadie RhineWickline, Donald, MD  omeprazole (PRILOSEC) 40 MG capsule Take 40 mg by mouth daily.    [provider]   Meds Ordered and Administered this Visit   Medications  acetaminophen (TYLENOL) tablet 650 mg (650 mg Oral Given 08/11/16 1135)    BP 122/86   Pulse (!) 106   Temp (!) 101.6 F (38.7 C) (Oral)   Resp 16   Wt 210 lb (95.3 kg)   SpO2 94%   BMI 31.01 kg/m  No data found.   Physical Exam  Constitutional: He is oriented to person, place, and time. He appears well-developed and well-nourished. No distress.  HENT:  Head: Normocephalic and atraumatic.  Right Ear: Tympanic membrane, external ear and ear canal normal. Tympanic membrane is not erythematous and not bulging.  Left Ear: Tympanic membrane, external ear and ear canal normal. Tympanic membrane is not erythematous and not bulging.  Nose: Nose normal. Right sinus exhibits no maxillary sinus tenderness and no frontal sinus tenderness. Left sinus exhibits no maxillary sinus tenderness and no frontal  sinus tenderness.  Mouth/Throat: Uvula is midline, oropharynx is clear and moist and mucous membranes are normal.  Eyes: Pupils are equal, round, and reactive to light. Conjunctivae are normal.  Neck: Normal range of motion. Neck supple.  Cardiovascular: Normal rate, regular rhythm and normal heart sounds.  Exam reveals no gallop and no friction rub.   No murmur heard. Pulmonary/Chest: Effort normal.  Mild crackles in bilateral bases, does not resolve with cough.  Lymphadenopathy:    He has no cervical adenopathy.  Neurological: He is alert and oriented to person, place, and time.  Skin:  Urticaria of the extremity.  Psychiatric: He has a normal mood and affect. His behavior is normal. Judgment normal.    Urgent Care Course     Procedures (including critical care time)  Labs Review Labs Reviewed - No data to display  Imaging Review Dg Chest 2 View  Result Date: 08/11/2016 CLINICAL DATA:  56 year old male with illness and fever for 6 days. Cough, congestion, pressure. Fever of 101 today. Abnormal bilateral lung auscultation. EXAM: CHEST  2 VIEW COMPARISON:  None. FINDINGS: Lobar consolidation in the lingula and likely also a portion of the inferior left upper lobe. Air bronchograms. No pleural effusion. Normal cardiac size and mediastinal contours. Visualized tracheal air column is within normal limits. The right lung appears negative. No pneumothorax or pulmonary edema. Negative visible bowel gas pattern. No acute osseous abnormality identified. IMPRESSION: Lobar consolidation in the lingula, and likely also the inferior left upper lobe, is compatible with Multilobar Pneumonia in this clinical setting. No pleural effusion. Top etiologies would include Streptococcus, Klebsiella, Legionella, H influenza. Followup PA and lateral chest X-ray is recommended in 3-4 weeks following trial of antibiotic therapy to ensure resolution and exclude underlying malignancy. These results will be called to  the ordering clinician or representative by the Radiologist Assistant, and communication documented in the PACS or zVision Dashboard. Electronically Signed   By: Odessa FlemingH  Hall M.D.   On: 08/11/2016 12:14        MDM   1. Pneumonia of left upper lobe due to infectious organism [facility](HCC)    Discussed imaging results with patient, x-ray consistent with multilobar pneumonia. Given patient is not in acute distress, no relevant comorbidities, age is less than 54 years old, can treat with outpatient therapy. Start azithromycin 500 mg daily for 5 days. Patient can take Zyrtec for urticaria. Continue to monitor symptoms. Patient to follow up at the emergency department if experiencing worsening of symptoms, shortness of breath, chest pain. Patient to follow up with occupational health for further days off as needed. Discussed with patient radiology report recommended repeat chest x-ray 3-4 weeks from now to ensure resolution and exclude underlying malignancy. Patient with a PCP at this moment, but is actively looking for one. Radiology report given to patient, resources provided for PCP. Can follow up here or with PCP 3-4 weeks from now for repeat chest x-ray.   Belinda FisherYu, Amy V, PA-C 08/11/16 1413

## 2016-08-25 ENCOUNTER — Encounter: Payer: Self-pay | Admitting: Physician Assistant

## 2016-08-25 ENCOUNTER — Ambulatory Visit (INDEPENDENT_AMBULATORY_CARE_PROVIDER_SITE_OTHER): Payer: Federal, State, Local not specified - PPO | Admitting: Physician Assistant

## 2016-08-25 VITALS — BP 121/76 | HR 79 | Temp 98.1°F | Ht 69.0 in | Wt 211.0 lb

## 2016-08-25 DIAGNOSIS — I10 Essential (primary) hypertension: Secondary | ICD-10-CM

## 2016-08-25 DIAGNOSIS — J189 Pneumonia, unspecified organism: Secondary | ICD-10-CM | POA: Diagnosis not present

## 2016-08-25 DIAGNOSIS — Z7689 Persons encountering health services in other specified circumstances: Secondary | ICD-10-CM

## 2016-08-25 HISTORY — DX: Pneumonia, unspecified organism: J18.9

## 2016-08-25 NOTE — Progress Notes (Signed)
HPI:                                                                Todd Payne is a 56 y.o. male who presents to [facility]: Primary Care Sports Medicine today to establish care   Current Concerns include none  Patient diagnosed with multilobar CAP on 08/11/2016. CXR showed lobar consolidation in the lingula and inferior left upper lobe. Reports he is feeling significantly better. Endorses residual intermittent nonproductive cough. Denies fever, chills, hemoptysis, SOB, wheezing.  HTN: taking HCTZ 25mg  and Atenolol 5mg  daily. Compliant with medications. Diagnosed 10 years ago. Checks BP's at home. BP range 140's/80-90's. Denies vision change, headache, chest pain with exertion, orthopnea, lightheadedness, syncope and edema. Risk factors include: male sex, obesity  GERD: taking Omeprazole daily. Symptoms well controlled. Denies melena/hematochezia.  Health Maintenance Health Maintenance  Topic Date Due  . Hepatitis C Screening  July 28, 1960  . HIV Screening  11/08/1977  . TETANUS/TDAP  11/08/1981  . COLONOSCOPY  11/08/2012  . INFLUENZA VACCINE  08/09/2016     Past Medical History:  Diagnosis Date  . GERD (gastroesophageal reflux disease)   . Hypertension   . Osteoarthritis of right shoulder   . Pneumonia 08/2016   Past Surgical History:  Procedure Laterality Date  . APPENDECTOMY    . KNEE SURGERY Bilateral    Social History  Substance Use Topics  . Smoking status: Never Smoker  . Smokeless tobacco: Never Used  . Alcohol use Yes   family history includes Diabetes in his mother.  ROS: Review of Systems  Constitutional: Negative.   HENT: Negative.   Eyes: Positive for blurred vision (wears corrective lenses).  Respiratory: Positive for cough. Negative for hemoptysis, sputum production, shortness of breath and wheezing.   Cardiovascular: Negative.   Gastrointestinal: Positive for heartburn. Negative for blood in stool and melena.  Genitourinary:  Negative.   Musculoskeletal: Positive for joint pain (b/l knees, right shoulder).  Neurological: Negative.   Endo/Heme/Allergies: Negative.   Psychiatric/Behavioral: Negative.      Medications: Current Outpatient Prescriptions  Medication Sig Dispense Refill  . amLODipine (NORVASC) 5 MG tablet Take 5 mg by mouth daily.    . chlorpheniramine-HYDROcodone (TUSSIONEX PENNKINETIC ER) 10-8 MG/5ML SUER Take 5 mLs by mouth every 12 (twelve) hours as needed for cough. 115 mL 0  . hydrochlorothiazide (HYDRODIURIL) 25 MG tablet Take 25 mg by mouth daily.    Marland Kitchen ibuprofen (ADVIL,MOTRIN) 600 MG tablet Take 1 tablet (600 mg total) by mouth every 6 (six) hours as needed. 30 tablet 0  . omeprazole (PRILOSEC) 40 MG capsule Take 40 mg by mouth daily.     No current facility-administered medications for this visit.    Allergies  Allergen Reactions  . Benzonatate Hives       Objective:  BP 121/76   Pulse 79   Temp 98.1 F (36.7 C)   Ht 5\' 9"  (1.753 m)   Wt 211 lb (95.7 kg)   SpO2 97%   BMI 31.16 kg/m  Gen: well-groomed, cooperative, not ill-appearing, no distress HEENT: normal conjunctiva, wearing glasses, trachea midline Pulm: Normal work of breathing, normal phonation, clear to auscultation bilaterally CV: Normal rate, regular rhythm, s1 and s2 distinct, no murmurs, clicks or rubs, no carotid  bruit Neuro: alert and oriented x 3, normal tone, no tremor MSK: moving all extremities, normal gait and station, no peripheral edema Skin: warm and dry, no rashes or lesions on exposed skin, no cyanosis   No results found for this or any previous visit (from the past 72 hour(s)). No results found.  Depression screen PHQ 2/9 08/25/2016  Decreased Interest 1  Down, Depressed, Hopeless 1  PHQ - 2 Score 2     Assessment and Plan: 56 y.o. male with   1. Encounter to establish care - reviewed PMH - reviewed health maintenance - Colonoscopy UTD per patient, done in [place], requesting outside  records - negative PHQ2  2. Hypertension goal BP (blood pressure) < 130/80 BP Readings from Last 3 Encounters:  08/25/16 121/76  08/11/16 122/86  08/09/16 (!) 137/95  - BP in range on recheck - cont Amlodipine 5 and HCTZ 25mg  daily - limit sodium 1500mg  /day - DASH eating plan - follow-up Q32months   3. Community acquired pneumonia of left lung, unspecified part of lung - patient clinically improved and vital signs are normal - DG Chest 2 View; Future - assess in 4 weeks (6 weeks total) for resolution  Patient education and anticipatory guidance given Patient agrees with treatment plan Follow-up in 4 weeks for CPE with fasting labs or sooner as needed  Levonne Hubert PA-C

## 2016-08-25 NOTE — Patient Instructions (Addendum)
-   Limit your salt <1500 mg day - follow DASH eating plan - cont Amlodipine and HCTZ - Get a copy of colonscopy record - Bring your blood pressure cuff

## 2016-09-29 ENCOUNTER — Ambulatory Visit (INDEPENDENT_AMBULATORY_CARE_PROVIDER_SITE_OTHER): Payer: Federal, State, Local not specified - PPO

## 2016-09-29 ENCOUNTER — Encounter: Payer: Self-pay | Admitting: Physician Assistant

## 2016-09-29 ENCOUNTER — Ambulatory Visit (INDEPENDENT_AMBULATORY_CARE_PROVIDER_SITE_OTHER): Payer: Federal, State, Local not specified - PPO | Admitting: Physician Assistant

## 2016-09-29 VITALS — BP 141/99 | HR 66 | Wt 217.0 lb

## 2016-09-29 DIAGNOSIS — J189 Pneumonia, unspecified organism: Secondary | ICD-10-CM | POA: Diagnosis not present

## 2016-09-29 DIAGNOSIS — E6609 Other obesity due to excess calories: Secondary | ICD-10-CM | POA: Diagnosis not present

## 2016-09-29 DIAGNOSIS — Z23 Encounter for immunization: Secondary | ICD-10-CM | POA: Diagnosis not present

## 2016-09-29 DIAGNOSIS — Z136 Encounter for screening for cardiovascular disorders: Secondary | ICD-10-CM | POA: Diagnosis not present

## 2016-09-29 DIAGNOSIS — Z131 Encounter for screening for diabetes mellitus: Secondary | ICD-10-CM | POA: Diagnosis not present

## 2016-09-29 DIAGNOSIS — Z130 Encounter for screening for diseases of the blood and blood-forming organs and certain disorders involving the immune mechanism: Secondary | ICD-10-CM

## 2016-09-29 DIAGNOSIS — Z0000 Encounter for general adult medical examination without abnormal findings: Secondary | ICD-10-CM | POA: Diagnosis not present

## 2016-09-29 DIAGNOSIS — Z1159 Encounter for screening for other viral diseases: Secondary | ICD-10-CM

## 2016-09-29 DIAGNOSIS — Z13220 Encounter for screening for lipoid disorders: Secondary | ICD-10-CM

## 2016-09-29 DIAGNOSIS — Z6832 Body mass index (BMI) 32.0-32.9, adult: Secondary | ICD-10-CM

## 2016-09-29 DIAGNOSIS — I10 Essential (primary) hypertension: Secondary | ICD-10-CM

## 2016-09-29 NOTE — Addendum Note (Signed)
Addended by: Thom Chimes on: 09/29/2016 08:41 AM   Modules accepted: Orders

## 2016-09-29 NOTE — Progress Notes (Signed)
HPI:                                                                Todd Payne is a 56 y.o. male who presents to [facility]: Primary Care Sports Medicine today for annual physical exam  Current Concerns include none  Health Maintenance Health Maintenance  Topic Date Due  . Hepatitis C Screening  March 24, 1960  . TETANUS/TDAP  11/08/1981  . COLONOSCOPY  11/08/2012  . INFLUENZA VACCINE  09/29/2017 (Originally 08/09/2016)  . HIV Screening  09/29/2021 (Originally 11/08/1977)    Past Medical History:  Diagnosis Date  . GERD (gastroesophageal reflux disease)   . Hypertension   . Osteoarthritis of right shoulder   . Pneumonia 08/2016   Past Surgical History:  Procedure Laterality Date  . APPENDECTOMY    . KNEE SURGERY Bilateral   . MENISCUS REPAIR     Social History  Substance Use Topics  . Smoking status: Never Smoker  . Smokeless tobacco: Never Used  . Alcohol use 3.6 oz/week    6 Cans of beer per week   family history includes Diabetes in his mother.  ROS: Review of Systems  Constitutional: Negative.   HENT: Negative.   Eyes: Positive for blurred vision (wears corrective lenses).  Respiratory: Negative.   Cardiovascular: Negative.   Gastrointestinal: Positive for heartburn.  Genitourinary: Negative.        - obstructive sx  Musculoskeletal: Negative.   Skin: Negative.   Neurological: Negative.   Endo/Heme/Allergies: Negative.   Psychiatric/Behavioral: Negative.      Medications: Current Outpatient Prescriptions  Medication Sig Dispense Refill  . amLODipine (NORVASC) 5 MG tablet Take 5 mg by mouth daily.    . hydrochlorothiazide (HYDRODIURIL) 25 MG tablet Take 25 mg by mouth daily.    Marland Kitchen ibuprofen (ADVIL,MOTRIN) 600 MG tablet Take 1 tablet (600 mg total) by mouth every 6 (six) hours as needed. 30 tablet 0   No current facility-administered medications for this visit.    Allergies  Allergen Reactions  . Ace Inhibitors Cough  .  Benzonatate Hives     Objective:  BP (!) 136/97   Pulse 66   Wt 217 lb (98.4 kg)   BMI 32.05 kg/m  General Appearance:  Alert, cooperative, no distress, appropriate for age, obese male                            Head:  Normocephalic, without obvious abnormality                             Eyes:  PERRL, EOM's intact, conjunctiva and cornea clear, wearing glasses                             Ears:  TM pearly gray color and semitransparent, external ear canals normal, both ears                            Nose:  Nares symmetrical  Throat:  Lips, tongue, and mucosa are moist, pink, and intact; good dentition                             Neck:  Supple; symmetrical, trachea midline, no adenopathy; thyroid: no enlargement, symmetric, no tenderness/mass/nodules                             Back:  Symmetrical, no curvature, ROM normal                                       Lungs:  Clear to auscultation bilaterally, respirations unlabored                             Heart:  regular rate & normal rhythm, S1 and S2 normal, no murmurs, rubs, or gallops                     Abdomen:  Soft, non-tender, no mass or organomegaly              Genitourinary:  deferred         Musculoskeletal:  Tone and strength strong and symmetrical, all extremities; no joint pain or edema, normal gait and station                                    Lymphatic:  No adenopathy             Skin/Hair/Nails:  Skin warm, dry and intact, no rashes or abnormal dyspigmentation on limited exam                   Neurologic:  Alert and oriented x3, no cranial nerve deficits, DTR's intact, sensation grossly intact, normal gait and station, no tremor Psych: well-groomed, cooperative, good eye contact, euthymic mood, affect mood-congruent, speech is articulate, and thought processes clear and goal-directed    No results found for this or any previous visit (from the past 72 hour(s)). No results found.  Depression  screen PHQ 2/9 08/25/2016  Decreased Interest 1  Down, Depressed, Hopeless 1  PHQ - 2 Score 2    Assessment and Plan: 56 y.o. male with   1. Encounter for annual physical exam - fasting labs as below - declines PSA, risks benefits discussed - Tdap given - Colonoscopy UTD per patient, unsure of practice location in [place], [place] - declines STI testing  2. Screening for blood disease - CBC  3. Screening for diabetes mellitus (DM) - Comprehensive metabolic panel  4. Encounter for lipid screening for cardiovascular disease - Lipid Panel w/reflex Direct LDL    5. Need for hepatitis C screening test - Hepatitis C antibody  6. Need for Tdap vaccination   7. Class 1 obesity due to excess calories with serious comorbidity and body mass index (BMI) of 32.0 to 32.9 in adult Wt Readings from Last 3 Encounters:  09/29/16 217 lb (98.4 kg)  08/25/16 211 lb (95.7 kg)  08/11/16 210 lb (95.3 kg)    8. Community acquired pneumonia of left lung, unspecified part of lung - DG Chest 2 View to assess for resolution, 6 weeks. Patient is asymptomatic  9. Elevated blood pressure  reading with diagnosis of hypertension BP Readings from Last 3 Encounters:  09/29/16 (!) 136/97  08/25/16 121/76  08/11/16 122/86  - BP out of range today. Asymptomatic. Reports weight gain and poor diet over the last month. - therapeutic lifestyle changes. If no improvement in 4 weeks, increase Amlodipine to  - log BP's at home, goal <130/80   Patient education and anticipatory guidance given Patient agrees with treatment plan Follow-up in 6 months for HTN or sooner as needed  Levonne Hubert PA-C

## 2016-09-29 NOTE — Progress Notes (Signed)
Good morning Todd Payne,  Your x-ray was normal. The pneumonia is gone.

## 2016-09-29 NOTE — Patient Instructions (Signed)
Obesity, Adult Obesity is the condition of having too much total body fat. Being overweight or obese means that your weight is greater than what is considered healthy for your body size. Obesity is determined by a measurement called BMI. BMI is an estimate of body fat and is calculated from height and weight. For adults, a BMI of 30 or higher is considered obese. Obesity can eventually lead to other health concerns and major illnesses, including:  Stroke.  Coronary artery disease (CAD).  Type 2 diabetes.  Some types of cancer, including cancers of the colon, breast, uterus, and gallbladder.  Osteoarthritis.  High blood pressure (hypertension).  High cholesterol.  Sleep apnea.  Gallbladder stones.  Infertility problems.  What are the causes? The main cause of obesity is taking in (consuming) more calories than your body uses for energy. Other factors that contribute to this condition may include:  Being born with genes that make you more likely to become obese.  Having a medical condition that causes obesity. These conditions include: ? Hypothyroidism. ? Polycystic ovarian syndrome (PCOS). ? Binge-eating disorder. ? Cushing syndrome.  Taking certain medicines, such as steroids, antidepressants, and seizure medicines.  Not being physically active (sedentary lifestyle).  Living where there are limited places to exercise safely or buy healthy foods.  Not getting enough sleep.  What increases the risk? The following factors may increase your risk of this condition:  Having a family history of obesity.  Being a woman of African-American descent.  Being a man of Hispanic descent.  What are the signs or symptoms? Having excessive body fat is the main symptom of this condition. How is this diagnosed? This condition may be diagnosed based on:  Your symptoms.  Your medical history.  A physical exam. Your health care provider may measure: ? Your BMI. If you are an  adult with a BMI between 25 and less than 30, you are considered overweight. If you are an adult with a BMI of 30 or higher, you are considered obese. ? The distances around your hips and your waist (circumferences). These may be compared to each other to help diagnose your condition. ? Your skinfold thickness. Your health care provider may gently pinch a fold of your skin and measure it.  How is this treated? Treatment for this condition often includes changing your lifestyle. Treatment may include some or all of the following:  Dietary changes. Work with your health care provider and a dietitian to set a weight-loss goal that is healthy and reasonable for you. Dietary changes may include eating: ? Smaller portions. A portion size is the amount of a particular food that is healthy for you to eat at one time. This varies from person to person. ? Low-calorie or low-fat options. ? More whole grains, fruits, and vegetables.  Regular physical activity. This may include aerobic activity (cardio) and strength training.  Medicine to help you lose weight. Your health care provider may prescribe medicine if you are unable to lose 1 pound a week after 6 weeks of eating more healthily and doing more physical activity.  Surgery. Surgical options may include gastric banding and gastric bypass. Surgery may be done if: ? Other treatments have not helped to improve your condition. ? You have a BMI of 40 or higher. ? You have life-threatening health problems related to obesity.  Follow these instructions at home:  Eating and drinking   Follow recommendations from your health care provider about what you eat and drink. Your health  care provider may advise you to: ? Limit fast foods, sweets, and processed snack foods. ? Choose low-fat options, such as low-fat milk instead of whole milk. ? Eat 5 or more servings of fruits or vegetables every day. ? Eat at home more often. This gives you more control over  what you eat. ? Choose healthy foods when you eat out. ? Learn what a healthy portion size is. ? Keep low-fat snacks on hand. ? Avoid sugary drinks, such as soda, fruit juice, iced tea sweetened with sugar, and flavored milk. ? Eat a healthy breakfast.  Drink enough water to keep your urine clear or pale yellow.  Do not go without eating for long periods of time (do not fast) or follow a fad diet. Fasting and fad diets can be unhealthy and even dangerous. Physical Activity  Exercise regularly, as told by your health care provider. Ask your health care provider what types of exercise are safe for you and how often you should exercise.  Warm up and stretch before being active.  Cool down and stretch after being active.  Rest between periods of activity. Lifestyle  Limit the time that you spend in front of your TV, computer, or video game system.  Find ways to reward yourself that do not involve food.  Limit alcohol intake to no more than 1 drink a day for nonpregnant women and 2 drinks a day for men. One drink equals 12 oz of beer, 5 oz of wine, or 1 oz of hard liquor. General instructions  Keep a weight loss journal to keep track of the food you eat and how much you exercise you get.  Take over-the-counter and prescription medicines only as told by your health care provider.  Take vitamins and supplements only as told by your health care provider.  Consider joining a support group. Your health care provider may be able to recommend a support group.  Keep all follow-up visits as told by your health care provider. This is important. Contact a health care provider if:  You are unable to meet your weight loss goal after 6 weeks of dietary and lifestyle changes. This information is not intended to replace advice given to you by your health care provider. Make sure you discuss any questions you have with your health care provider. Document Released: 02/03/2004 Document Revised:  05/31/2015 Document Reviewed: 10/14/2014 Elsevier Interactive Patient Education  2017 [employer].     Preventive Care 40-64 Years, Male Preventive care refers to lifestyle choices and visits with your health care provider that can promote health and wellness. What does preventive care include?  A yearly physical exam. This is also called an annual well check.  Dental exams once or twice a year.  Routine eye exams. Ask your health care provider how often you should have your eyes checked.  Personal lifestyle choices, including: ? Daily care of your teeth and gums. ? Regular physical activity. ? Eating a healthy diet. ? Avoiding tobacco and drug use. ? Limiting alcohol use. ? Practicing safe sex. ? Taking low-dose aspirin every day starting at age 2. What happens during an annual well check? The services and screenings done by your health care provider during your annual well check will depend on your age, overall health, lifestyle risk factors, and family history of disease. Counseling Your health care provider may ask you questions about your:  Alcohol use.  Tobacco use.  Drug use.  Emotional well-being.  Home and relationship well-being.  Sexual activity.  Eating habits.  Work and work Statistician.  Screening You may have the following tests or measurements:  Height, weight, and BMI.  Blood pressure.  Lipid and cholesterol levels. These may be checked every 5 years, or more frequently if you are over 12 years old.  Skin check.  Lung cancer screening. You may have this screening every year starting at age 91 if you have a 30-pack-year history of smoking and currently smoke or have quit within the past 15 years.  Fecal occult blood test (FOBT) of the stool. You may have this test every year starting at age 5.  Flexible sigmoidoscopy or colonoscopy. You may have a sigmoidoscopy every 5 years or a colonoscopy every 10 years starting at age 29.  Prostate  cancer screening. Recommendations will vary depending on your family history and other risks.  Hepatitis C blood test.  Hepatitis B blood test.  Sexually transmitted disease (STD) testing.  Diabetes screening. This is done by checking your blood sugar (glucose) after you have not eaten for a while (fasting). You may have this done every 1-3 years.  Discuss your test results, treatment options, and if necessary, the need for more tests with your health care provider. Vaccines Your health care provider may recommend certain vaccines, such as:  Influenza vaccine. This is recommended every year.  Tetanus, diphtheria, and acellular pertussis (Tdap, Td) vaccine. You may need a Td booster every 10 years.  Varicella vaccine. You may need this if you have not been vaccinated.  Zoster vaccine. You may need this after age 23.  Measles, mumps, and rubella (MMR) vaccine. You may need at least one dose of MMR if you were born in 1957 or later. You may also need a second dose.  Pneumococcal 13-valent conjugate (PCV13) vaccine. You may need this if you have certain conditions and have not been vaccinated.  Pneumococcal polysaccharide (PPSV23) vaccine. You may need one or two doses if you smoke cigarettes or if you have certain conditions.  Meningococcal vaccine. You may need this if you have certain conditions.  Hepatitis A vaccine. You may need this if you have certain conditions or if you travel or work in places where you may be exposed to hepatitis A.  Hepatitis B vaccine. You may need this if you have certain conditions or if you travel or work in places where you may be exposed to hepatitis B.  Haemophilus influenzae type b (Hib) vaccine. You may need this if you have certain risk factors.  Talk to your health care provider about which screenings and vaccines you need and how often you need them. This information is not intended to replace advice given to you by your health care provider.  Make sure you discuss any questions you have with your health care provider. Document Released: 01/22/2015 Document Revised: 09/15/2015 Document Reviewed: 10/27/2014 Elsevier Interactive Patient Education  2017 [employer].

## 2016-09-30 LAB — COMPREHENSIVE METABOLIC PANEL
AG Ratio: 1.4 (calc) (ref 1.0–2.5)
ALT: 16 U/L (ref 9–46)
AST: 17 U/L (ref 10–35)
Albumin: 4.2 g/dL (ref 3.6–5.1)
Alkaline phosphatase (APISO): 81 U/L (ref 40–115)
BUN: 10 mg/dL (ref 7–25)
CO2: 28 mmol/L (ref 20–32)
Calcium: 9.3 mg/dL (ref 8.6–10.3)
Chloride: 105 mmol/L (ref 98–110)
Creat: 1.14 mg/dL (ref 0.70–1.33)
Globulin: 2.9 g/dL (calc) (ref 1.9–3.7)
Glucose, Bld: 92 mg/dL (ref 65–99)
Potassium: 4.6 mmol/L (ref 3.5–5.3)
Sodium: 139 mmol/L (ref 135–146)
Total Bilirubin: 0.3 mg/dL (ref 0.2–1.2)
Total Protein: 7.1 g/dL (ref 6.1–8.1)

## 2016-09-30 LAB — CBC
HCT: 43.1 % (ref 38.5–50.0)
Hemoglobin: 14.1 g/dL (ref 13.2–17.1)
MCH: 26.5 pg — ABNORMAL LOW (ref 27.0–33.0)
MCHC: 32.7 g/dL (ref 32.0–36.0)
MCV: 81 fL (ref 80.0–100.0)
MPV: 10.5 fL (ref 7.5–12.5)
Platelets: 264 10*3/uL (ref 140–400)
RBC: 5.32 10*6/uL (ref 4.20–5.80)
RDW: 15.7 % — ABNORMAL HIGH (ref 11.0–15.0)
WBC: 3.7 10*3/uL — ABNORMAL LOW (ref 3.8–10.8)

## 2016-09-30 LAB — LIPID PANEL W/REFLEX DIRECT LDL
Cholesterol: 206 mg/dL — ABNORMAL HIGH (ref <200)
HDL: 101 mg/dL (ref >40)
LDL Cholesterol (Calc): 81 mg/dL (calc)
Non-HDL Cholesterol (Calc): 105 mg/dL (calc) (ref <130)
Total CHOL/HDL Ratio: 2 (calc) (ref <5.0)
Triglycerides: 139 mg/dL (ref <150)

## 2016-09-30 LAB — HEPATITIS C ANTIBODY
Hepatitis C Ab: NONREACTIVE
SIGNAL TO CUT-OFF: 0.01 (ref <1.00)

## 2016-10-02 NOTE — Progress Notes (Signed)
Good morning Todd Payne,  Your labs look good - normal kidney function - cholesterol in a healthy range - normal blood counts - no evidence of diabetes  Hope you are feeling better! Vinetta Bergamo

## 2017-01-10 ENCOUNTER — Encounter: Payer: Self-pay | Admitting: Physician Assistant

## 2017-01-10 ENCOUNTER — Ambulatory Visit (INDEPENDENT_AMBULATORY_CARE_PROVIDER_SITE_OTHER): Payer: Federal, State, Local not specified - PPO | Admitting: Physician Assistant

## 2017-01-10 VITALS — BP 136/86 | HR 65 | Temp 98.2°F | Resp 16 | Ht 69.0 in | Wt 224.0 lb

## 2017-01-10 DIAGNOSIS — I10 Essential (primary) hypertension: Secondary | ICD-10-CM

## 2017-01-10 DIAGNOSIS — Z8719 Personal history of other diseases of the digestive system: Secondary | ICD-10-CM

## 2017-01-10 DIAGNOSIS — F1020 Alcohol dependence, uncomplicated: Secondary | ICD-10-CM | POA: Diagnosis not present

## 2017-01-10 DIAGNOSIS — D509 Iron deficiency anemia, unspecified: Secondary | ICD-10-CM | POA: Diagnosis not present

## 2017-01-10 DIAGNOSIS — Z1321 Encounter for screening for nutritional disorder: Secondary | ICD-10-CM

## 2017-01-10 DIAGNOSIS — R1013 Epigastric pain: Secondary | ICD-10-CM | POA: Diagnosis not present

## 2017-01-10 DIAGNOSIS — K921 Melena: Secondary | ICD-10-CM | POA: Diagnosis not present

## 2017-01-10 DIAGNOSIS — F1094 Alcohol use, unspecified with alcohol-induced mood disorder: Secondary | ICD-10-CM | POA: Diagnosis not present

## 2017-01-10 DIAGNOSIS — Z1211 Encounter for screening for malignant neoplasm of colon: Secondary | ICD-10-CM | POA: Diagnosis not present

## 2017-01-10 DIAGNOSIS — F4310 Post-traumatic stress disorder, unspecified: Secondary | ICD-10-CM

## 2017-01-10 DIAGNOSIS — Z789 Other specified health status: Secondary | ICD-10-CM | POA: Diagnosis not present

## 2017-01-10 DIAGNOSIS — D649 Anemia, unspecified: Secondary | ICD-10-CM | POA: Diagnosis not present

## 2017-01-10 MED ORDER — OMEPRAZOLE 40 MG PO CPDR: 40.0000 mg | DELAYED_RELEASE_CAPSULE | Freq: Every day | ORAL | 1 refills | Status: DC

## 2017-01-10 NOTE — Progress Notes (Signed)
HPI:                                                                Todd Payne is a 57 y.o. male who presents to [facility]: Primary Care Sports Medicine today for abdominal pain  Patient is accompanied by his wife today.  Patient reports epigastric pain and dyspepsia gradually worsening for the last 2 months. Endorses throat clearing, globus sensation and dry, nagging cough. Reports a couple episodes of melena last week.   Reports drinking 3 shots of liquor and 2 or more beers nightly. Wife states drinking is problematic. Patient states it is negatively affecting his marriage and family. Reports history of alcohol abuse and multiple DUI's. Denies history of known liver disease.   Patient states he was diagnosed at the [place] with PTSD. He has tried group therapy and AA in the past and states this does not work well for him. Reports he does better with individual therapy. He is not currently in any treatment for his psychiatric condition.  Depression screen [facility] 2/9 01/10/2017 08/25/2016  Decreased Interest 1 1  Down, Depressed, Hopeless 1 1  PHQ - 2 Score 2 2  Altered sleeping 1 -  Tired, decreased energy 1 -  Change in appetite 1 -  Feeling bad or failure about yourself  1 -  Trouble concentrating 1 -  Moving slowly or fidgety/restless 1 -  Suicidal thoughts 0 -  PHQ-9 Score 8 -  Difficult doing work/chores Very difficult -   GAD 7 : Generalized Anxiety Score 01/10/2017  Nervous, Anxious, on Edge 1  Control/stop worrying 1  Worry too much - different things 1  Trouble relaxing 1  Restless 0  Easily annoyed or irritable 1  Afraid - awful might happen 0  Total GAD 7 Score 5     Past Medical History:  Diagnosis Date  . Alcohol abuse   . GERD (gastroesophageal reflux disease)   . Hypertension   . Osteoarthritis of right shoulder   . Pneumonia 08/2016  . PTSD (post-traumatic stress disorder)    Past Surgical History:  Procedure Laterality Date  .  APPENDECTOMY    . KNEE SURGERY Bilateral   . MENISCUS REPAIR     Social History   Tobacco Use  . Smoking status: Never Smoker  . Smokeless tobacco: Never Used  Substance Use Topics  . Alcohol use: Yes    Alcohol/week: 25.2 oz    Types: 21 Cans of beer, 21 Shots of liquor per week   family history includes Diabetes in his mother.  ROS: negative except as noted in the HPI  Medications: Current Outpatient Medications  Medication Sig Dispense Refill  . amLODipine (NORVASC) 5 MG tablet Take 5 mg by mouth daily.    . hydrochlorothiazide (HYDRODIURIL) 25 MG tablet Take 25 mg by mouth daily.    Marland Kitchen omeprazole (PRILOSEC) 40 MG capsule Take 1 capsule (40 mg total) by mouth at bedtime. 30 capsule 1   No current facility-administered medications for this visit.    Allergies  Allergen Reactions  . Ace Inhibitors Cough  . Benzonatate Hives       Objective:  BP 136/86   Pulse 65   Temp 98.2 F (36.8 C) (Oral)   Resp  16   Ht 5\' 9"  (1.753 m)   Wt 224 lb (101.6 kg)   SpO2 92%   BMI 33.08 kg/m  Gen:  alert, not ill-appearing, no distress, appropriate for age, obese male HEENT: head normocephalic without obvious abnormality, conjunctiva and cornea clear, wearing glasses, oropharynx clear, neck supple, trachea midline Pulm: Normal work of breathing, normal phonation, clear to auscultation bilaterally, no wheezes, rales or rhonchi CV: Normal rate, regular rhythm, s1 and s2 distinct, no murmurs, clicks or rubs GI: abdomen obese, soft, there is epigastric tenderness and LUQ tenderness, no rebound or guarding, negative Murphy's sign, exam limited due to body habitus  Neuro: alert and oriented x 3, no tremor MSK: no chest wall tenderness; extremities atraumatic, normal gait and station Skin: intact, no rashes on exposed skin, no jaundice, no cyanosis Psych: well-groomed, cooperative, good eye contact, depressed mood, tearful, affect mood-congruent, speech is articulate, and thought  processes clear and goal-directed    No results found for this or any previous visit (from the past 72 hour(s)). [place] Abdomen Complete  Result Date: 01/12/2017 CLINICAL DATA:  Acute epigastric abdominal pain. EXAM: ABDOMEN ULTRASOUND COMPLETE COMPARISON:  None. FINDINGS: Gallbladder: No gallstones or wall thickening visualized. No sonographic Murphy sign noted by sonographer. Mild amount of sludge is noted. Common bile duct: Diameter: 3 mm which is within normal limits. Liver: No focal lesion identified. Within normal limits in parenchymal echogenicity. Portal vein is patent on color Doppler imaging with normal direction of blood flow towards the liver. IVC: Visualized portion appears normal. Pancreas: Visualized portion unremarkable. Spleen: Size and appearance within normal limits. Right Kidney: Length: 10.3 cm. Echogenicity within normal limits. No mass or hydronephrosis visualized. Left Kidney: Length: 10.8 cm. Echogenicity within normal limits. No mass or hydronephrosis visualized. Abdominal aorta: No aneurysm visualized. Other findings: None. IMPRESSION: Mild amount of sludge seen within gallbladder lumen. No other significant abnormality seen in the abdomen. Electronically Signed   By: Lupita RaiderJames  Green Jr, M.D.   On: 01/12/2017 11:43      Assessment and Plan: 57 y.o. male with   1. Acute epigastric pain - starting Omeprazole 40mg  QHS for gastritis/esophagitis. Patient counseled on GERD diet - Ambulatory referral to Gastroenterology - US Abdomen Complete; Future - CBC with Differential/Platelet - Comprehensive metabolic panel - Lipase - omeprazole (PRILOSEC) 40 MG capsule; Take 1 capsule (40 mg total) by mouth at bedtime.  Dispense: 30 capsule; Refill: 1  2. Alcohol use disorder, severe, in controlled environment, dependence (HCC) - AUDIT score 26, severe - patient is interested in outpatient rehab. Referring to Addiction Medicine - we discussed importance of slowly cutting back on drinking  and never to stop drinking abruptly as this can cause life-threatening withdrawal and seizures.  - Ambulatory referral to Gastroenterology - US Abdomen Complete; Future - CBC with Differential/Platelet - Comprehensive metabolic panel - Lipase - Ambulatory referral to Behavioral Health - B12 and Folate Panel - Protime-INR  3. History of melena - CBC - patient provided with FOBT stool cards - Ambulatory referral to Gastroenterology  4. Encounter for vitamin deficiency screening - B12 and Folate Panel  5. Screening for colon cancer - Ambulatory referral to Gastroenterology  6. Hypertension BP Readings from Last 3 Encounters:  01/10/17 136/86  09/29/16 (!) 141/99  08/25/16 121/76  - stopping HCTZ to prevent possible electrolyte abnormality with comorbid alcohol use disorder - increasing Amlodipine to 10 mg daily and adding Losartan 50 mg - therapeutic lifestyle changes  7. Alcohol-induced mood disorder with depressive symptoms -  PHQ9 score 8, no acute safety issues; GAD7 score 5, mild - Ambulatory referral to Behavioral Health  Patient education and anticipatory guidance given Patient agrees with treatment plan Follow-up in 4 weeks for hypertension/epigastric pain or sooner as needed if symptoms worsen or fail to improve  I spent 40 minutes with this patient, greater than 50% was face-to-face time counseling regarding the above diagnoses   Levonne Hubert PA-C

## 2017-01-10 NOTE — Patient Instructions (Signed)
-   start Omeprazole 40 mg at bedtime - follow GERD diet (below) - do not stop drinking alcohol abruptly  Food Choices for Gastroesophageal Reflux Disease, Adult When you have gastroesophageal reflux disease (GERD), the foods you eat and your eating habits are very important. Choosing the right foods can help ease your discomfort. What guidelines do I need to follow?  Choose fruits, vegetables, whole grains, and low-fat dairy products.  Choose low-fat meat, fish, and poultry.  Limit fats such as oils, salad dressings, butter, nuts, and avocado.  Keep a food diary. This helps you identify foods that cause symptoms.  Avoid foods that cause symptoms. These may be different for everyone.  Eat small meals often instead of 3 large meals a day.  Eat your meals slowly, in a place where you are relaxed.  Limit fried foods.  Cook foods using methods other than frying.  Avoid drinking alcohol.  Avoid drinking large amounts of liquids with your meals.  Avoid bending over or lying down until 2-3 hours after eating. What foods are not recommended? These are some foods and drinks that may make your symptoms worse: Vegetables Tomatoes. Tomato juice. Tomato and spaghetti sauce. Chili peppers. Onion and garlic. Horseradish. Fruits Oranges, grapefruit, and lemon (fruit and juice). Meats High-fat meats, fish, and poultry. This includes hot dogs, ribs, ham, sausage, salami, and bacon. Dairy Whole milk and chocolate milk. Sour cream. Cream. Butter. Ice cream. Cream cheese. Drinks Coffee and tea. Bubbly (carbonated) drinks or energy drinks. Condiments Hot sauce. Barbecue sauce. Sweets/Desserts Chocolate and cocoa. Donuts. Peppermint and spearmint. Fats and Oils High-fat foods. This includes Jamaica[place] fries and potato chips. Other Vinegar. Strong spices. This includes black pepper, white pepper, red pepper, cayenne, curry powder, cloves, ginger, and chili powder. The items listed above may not  be a complete list of foods and drinks to avoid. Contact your dietitian for more information. This information is not intended to replace advice given to you by your health care provider. Make sure you discuss any questions you have with your health care provider. Document Released: 06/27/2011 Document Revised: 06/03/2015 Document Reviewed: 10/30/2012 Elsevier Interactive Patient Education  2017 [employer]

## 2017-01-11 ENCOUNTER — Encounter: Payer: Self-pay | Admitting: Internal Medicine

## 2017-01-12 ENCOUNTER — Encounter: Payer: Self-pay | Admitting: Physician Assistant

## 2017-01-12 ENCOUNTER — Ambulatory Visit (INDEPENDENT_AMBULATORY_CARE_PROVIDER_SITE_OTHER): Payer: Federal, State, Local not specified - PPO

## 2017-01-12 DIAGNOSIS — K828 Other specified diseases of gallbladder: Secondary | ICD-10-CM | POA: Diagnosis not present

## 2017-01-12 DIAGNOSIS — R1013 Epigastric pain: Secondary | ICD-10-CM

## 2017-01-12 DIAGNOSIS — F1090 Alcohol use, unspecified, uncomplicated: Secondary | ICD-10-CM

## 2017-01-12 DIAGNOSIS — Z789 Other specified health status: Secondary | ICD-10-CM

## 2017-01-14 ENCOUNTER — Encounter: Payer: Self-pay | Admitting: Physician Assistant

## 2017-01-14 DIAGNOSIS — F1020 Alcohol dependence, uncomplicated: Secondary | ICD-10-CM | POA: Insufficient documentation

## 2017-01-14 DIAGNOSIS — F4310 Post-traumatic stress disorder, unspecified: Secondary | ICD-10-CM | POA: Insufficient documentation

## 2017-01-14 DIAGNOSIS — D509 Iron deficiency anemia, unspecified: Secondary | ICD-10-CM

## 2017-01-14 DIAGNOSIS — R1013 Epigastric pain: Secondary | ICD-10-CM | POA: Insufficient documentation

## 2017-01-14 DIAGNOSIS — Z8719 Personal history of other diseases of the digestive system: Secondary | ICD-10-CM | POA: Insufficient documentation

## 2017-01-14 DIAGNOSIS — F1094 Alcohol use, unspecified with alcohol-induced mood disorder: Secondary | ICD-10-CM | POA: Insufficient documentation

## 2017-01-14 HISTORY — DX: Iron deficiency anemia, unspecified: D50.9

## 2017-01-14 MED ORDER — LOSARTAN POTASSIUM 50 MG PO TABS: 50.0000 mg | ORAL_TABLET | Freq: Every day | ORAL | 3 refills | Status: DC

## 2017-01-14 MED ORDER — AMLODIPINE BESYLATE 10 MG PO TABS: 10.0000 mg | ORAL_TABLET | Freq: Every day | ORAL | 3 refills | Status: DC

## 2017-01-14 NOTE — Progress Notes (Signed)
Your labs look good overall.  You are mildly anemic compared to 3 months ago. I think its possible you are losing small amounts of blood through your GI tract. Continue with the Omeprazole, complete the stool cards and return to the office, and definitely follow-up with the Gastroenterologist I referred you to.  Your abdominal ultrasound was normal. The liver was not enlarged and there was no fatty infiltration or lesions.   I am going to change up your blood pressure regimen a bit given what you shared with me at your last visit.  I would like you to stop the Hydrochlorothiazide because it can cause you to lose electrolytes and increase risk of dehydration in the setting of alcohol use. I want you to increase your Amlodipine to 10 mg daily and I'm adding a medication called Losartan. Come back and see me in 3 weeks. Keep an eye on your blood pressure at home (Goal <130/80).

## 2017-01-16 ENCOUNTER — Other Ambulatory Visit: Payer: Self-pay | Admitting: Physician Assistant

## 2017-01-16 DIAGNOSIS — M19011 Primary osteoarthritis, right shoulder: Secondary | ICD-10-CM

## 2017-01-16 MED ORDER — DICLOFENAC SODIUM 1 % TD GEL: 2.0000 g | Freq: Four times a day (QID) | TRANSDERMAL | 2 refills | Status: AC

## 2017-01-17 LAB — B12 AND FOLATE PANEL
Folate: 8.7 ng/mL
Vitamin B-12: 535 pg/mL (ref 200–1100)

## 2017-01-17 LAB — CBC WITH DIFFERENTIAL/PLATELET
Basophils Absolute: 31 cells/uL (ref 0–200)
Basophils Relative: 0.7 %
Eosinophils Absolute: 110 cells/uL (ref 15–500)
Eosinophils Relative: 2.5 %
HCT: 39 % (ref 38.5–50.0)
Hemoglobin: 12.9 g/dL — ABNORMAL LOW (ref 13.2–17.1)
Lymphs Abs: 1232 cells/uL (ref 850–3900)
MCH: 26.8 pg — ABNORMAL LOW (ref 27.0–33.0)
MCHC: 33.1 g/dL (ref 32.0–36.0)
MCV: 80.9 fL (ref 80.0–100.0)
MPV: 10.3 fL (ref 7.5–12.5)
Monocytes Relative: 10.6 %
Neutro Abs: 2561 cells/uL (ref 1500–7800)
Neutrophils Relative %: 58.2 %
Platelets: 308 10*3/uL (ref 140–400)
RBC: 4.82 10*6/uL (ref 4.20–5.80)
RDW: 14.4 % (ref 11.0–15.0)
Total Lymphocyte: 28 %
WBC mixed population: 466 cells/uL (ref 200–950)
WBC: 4.4 10*3/uL (ref 3.8–10.8)

## 2017-01-17 LAB — COMPREHENSIVE METABOLIC PANEL
AG Ratio: 1.4 (calc) (ref 1.0–2.5)
ALT: 23 U/L (ref 9–46)
AST: 22 U/L (ref 10–35)
Albumin: 4.1 g/dL (ref 3.6–5.1)
Alkaline phosphatase (APISO): 77 U/L (ref 40–115)
BUN: 10 mg/dL (ref 7–25)
CO2: 27 mmol/L (ref 20–32)
Calcium: 9.3 mg/dL (ref 8.6–10.3)
Chloride: 104 mmol/L (ref 98–110)
Creat: 1.07 mg/dL (ref 0.70–1.33)
Globulin: 3 g/dL (calc) (ref 1.9–3.7)
Glucose, Bld: 109 mg/dL — ABNORMAL HIGH (ref 65–99)
Potassium: 4.2 mmol/L (ref 3.5–5.3)
Sodium: 140 mmol/L (ref 135–146)
Total Bilirubin: 0.2 mg/dL (ref 0.2–1.2)
Total Protein: 7.1 g/dL (ref 6.1–8.1)

## 2017-01-17 LAB — PROTIME-INR
INR: 1
Prothrombin Time: 10.4 s (ref 9.0–11.5)

## 2017-01-17 LAB — LIPASE: Lipase: 20 U/L (ref 7–60)

## 2017-01-17 LAB — VITAMIN B1: Vitamin B1 (Thiamine): 8 nmol/L (ref 8–30)

## 2017-01-25 DIAGNOSIS — K080 Exfoliation of teeth due to systemic causes: Secondary | ICD-10-CM | POA: Diagnosis not present

## 2017-02-02 ENCOUNTER — Other Ambulatory Visit: Payer: Self-pay

## 2017-02-02 DIAGNOSIS — R1013 Epigastric pain: Secondary | ICD-10-CM

## 2017-02-02 MED ORDER — OMEPRAZOLE 40 MG PO CPDR: 40.0000 mg | DELAYED_RELEASE_CAPSULE | Freq: Every day | ORAL | 0 refills | Status: DC

## 2017-02-03 DIAGNOSIS — Z125 Encounter for screening for malignant neoplasm of prostate: Secondary | ICD-10-CM | POA: Diagnosis not present

## 2017-02-03 DIAGNOSIS — M7652 Patellar tendinitis, left knee: Secondary | ICD-10-CM | POA: Diagnosis not present

## 2017-02-03 DIAGNOSIS — Z0189 Encounter for other specified special examinations: Secondary | ICD-10-CM | POA: Diagnosis not present

## 2017-02-03 DIAGNOSIS — M170 Bilateral primary osteoarthritis of knee: Secondary | ICD-10-CM | POA: Diagnosis not present

## 2017-02-03 DIAGNOSIS — F1099 Alcohol use, unspecified with unspecified alcohol-induced disorder: Secondary | ICD-10-CM | POA: Diagnosis not present

## 2017-02-03 DIAGNOSIS — M19011 Primary osteoarthritis, right shoulder: Secondary | ICD-10-CM | POA: Diagnosis not present

## 2017-02-03 DIAGNOSIS — K219 Gastro-esophageal reflux disease without esophagitis: Secondary | ICD-10-CM | POA: Diagnosis not present

## 2017-02-03 DIAGNOSIS — M19012 Primary osteoarthritis, left shoulder: Secondary | ICD-10-CM | POA: Diagnosis not present

## 2017-02-03 DIAGNOSIS — M25861 Other specified joint disorders, right knee: Secondary | ICD-10-CM | POA: Diagnosis not present

## 2017-02-03 DIAGNOSIS — I10 Essential (primary) hypertension: Secondary | ICD-10-CM | POA: Diagnosis not present

## 2017-02-03 DIAGNOSIS — F1010 Alcohol abuse, uncomplicated: Secondary | ICD-10-CM | POA: Diagnosis not present

## 2017-02-03 DIAGNOSIS — M7651 Patellar tendinitis, right knee: Secondary | ICD-10-CM | POA: Diagnosis not present

## 2017-02-07 ENCOUNTER — Ambulatory Visit: Payer: Federal, State, Local not specified - PPO | Admitting: Internal Medicine

## 2017-02-07 ENCOUNTER — Telehealth: Payer: Self-pay | Admitting: Internal Medicine

## 2017-02-07 NOTE — Telephone Encounter (Signed)
No charge. 

## 2017-02-18 DIAGNOSIS — J111 Influenza due to unidentified influenza virus with other respiratory manifestations: Secondary | ICD-10-CM | POA: Diagnosis not present

## 2017-02-18 DIAGNOSIS — R509 Fever, unspecified: Secondary | ICD-10-CM | POA: Diagnosis not present

## 2017-02-28 DIAGNOSIS — Z01818 Encounter for other preprocedural examination: Secondary | ICD-10-CM | POA: Diagnosis not present

## 2017-03-06 DIAGNOSIS — K920 Hematemesis: Secondary | ICD-10-CM | POA: Diagnosis not present

## 2017-03-08 ENCOUNTER — Encounter: Payer: Self-pay | Admitting: Physician Assistant

## 2017-03-14 DIAGNOSIS — H524 Presbyopia: Secondary | ICD-10-CM | POA: Diagnosis not present

## 2017-03-29 ENCOUNTER — Encounter: Payer: Self-pay | Admitting: Physician Assistant

## 2017-03-29 ENCOUNTER — Ambulatory Visit: Payer: Federal, State, Local not specified - PPO | Admitting: Physician Assistant

## 2017-03-29 VITALS — BP 133/88 | HR 76 | Wt 222.0 lb

## 2017-03-29 DIAGNOSIS — G4733 Obstructive sleep apnea (adult) (pediatric): Secondary | ICD-10-CM | POA: Diagnosis not present

## 2017-03-29 DIAGNOSIS — D509 Iron deficiency anemia, unspecified: Secondary | ICD-10-CM | POA: Diagnosis not present

## 2017-03-29 DIAGNOSIS — R1013 Epigastric pain: Secondary | ICD-10-CM | POA: Diagnosis not present

## 2017-03-29 DIAGNOSIS — K828 Other specified diseases of gallbladder: Secondary | ICD-10-CM | POA: Diagnosis not present

## 2017-03-29 DIAGNOSIS — I10 Essential (primary) hypertension: Secondary | ICD-10-CM

## 2017-03-29 DIAGNOSIS — R1011 Right upper quadrant pain: Secondary | ICD-10-CM | POA: Diagnosis not present

## 2017-03-29 DIAGNOSIS — E6609 Other obesity due to excess calories: Secondary | ICD-10-CM | POA: Insufficient documentation

## 2017-03-29 MED ORDER — LOSARTAN POTASSIUM 100 MG PO TABS: 100.0000 mg | ORAL_TABLET | Freq: Every day | ORAL | 1 refills | Status: DC

## 2017-03-29 MED ORDER — PANTOPRAZOLE SODIUM 40 MG PO TBEC: 40.0000 mg | DELAYED_RELEASE_TABLET | Freq: Two times a day (BID) | ORAL | 1 refills | Status: DC

## 2017-03-29 MED ORDER — AMLODIPINE BESYLATE 10 MG PO TABS: 10.0000 mg | ORAL_TABLET | Freq: Every day | ORAL | 1 refills | Status: DC

## 2017-03-29 NOTE — Progress Notes (Signed)
HPI:                                                                Todd Payne is a 57 y.o. male who presents to [place]: [place] today for hypertension follow-up  HTN: taking Losartan and Amlodipine daily. Compliant with medications. Does not check BP's at home. Reports he was diagnosed with OSA at the [place]. Denies vision change, headache, chest pain with exertion, orthopnea, lightheadedness, syncope and edema. Risk factors include: male sex, obesity    Alcohol Use Disorder: stopped drinking cold [place] on January 1,2019. Denies tremors, change in mental status or seizures during that time. Started "Seeking Safety" program through the [place] for PTSD and substance abuse.   GERD: continues to endorse intermittent, vague RUQ abdominal pain. He had a abdominal ultrasound on 01/12/2017, which was unremarkable except for gallbladder sludge. States he had an EGD at the [place] last month, which was normal. He does not have these records with him today. Currently taking Omeprazole nightly and was started on Ranitidine 150 mg bid by the [place].  Depression screen [facility] 2/9 01/10/2017 08/25/2016  Decreased Interest 1 1  Down, Depressed, Hopeless 1 1  PHQ - 2 Score 2 2  Altered sleeping 1 -  Tired, decreased energy 1 -  Change in appetite 1 -  Feeling bad or failure about yourself  1 -  Trouble concentrating 1 -  Moving slowly or fidgety/restless 1 -  Suicidal thoughts 0 -  PHQ-9 Score 8 -  Difficult doing work/chores Very difficult -    GAD 7 : Generalized Anxiety Score 01/10/2017  Nervous, Anxious, on Edge 1  Control/stop worrying 1  Worry too much - different things 1  Trouble relaxing 1  Restless 0  Easily annoyed or irritable 1  Afraid - awful might happen 0  Total GAD 7 Score 5      Past Medical History:  Diagnosis Date  . Alcohol abuse   . GERD (gastroesophageal reflux disease)   . Hypertension   . Microcytic anemia 01/14/2017  . Osteoarthritis  of right shoulder   . Pneumonia 08/2016  . PTSD (post-traumatic stress disorder)    Past Surgical History:  Procedure Laterality Date  . APPENDECTOMY    . KNEE SURGERY Bilateral   . MENISCUS REPAIR     Social History   Tobacco Use  . Smoking status: Never Smoker  . Smokeless tobacco: Never Used  Substance Use Topics  . Alcohol use: Yes    Alcohol/week: 25.2 oz    Types: 21 Cans of beer, 21 Shots of liquor per week   family history includes Diabetes in his mother.    ROS: negative except as noted in the HPI  Medications: Current Outpatient Medications  Medication Sig Dispense Refill  . amLODipine (NORVASC) 10 MG tablet Take 1 tablet (10 mg total) by mouth daily. 30 tablet 3  . diclofenac sodium (VOLTAREN) 1 % GEL Apply 2 g topically 4 (four) times daily. To affected joint. 100 g 2  . losartan (COZAAR) 50 MG tablet Take 1 tablet (50 mg total) by mouth daily. 30 tablet 3  . omeprazole (PRILOSEC) 40 MG capsule Take 1 capsule (40 mg total) by mouth at bedtime. 90 capsule 0  No current facility-administered medications for this visit.    Allergies  Allergen Reactions  . Ace Inhibitors Cough  . Benzonatate Hives       Objective:  BP 133/88   Pulse 76   Wt 222 lb (100.7 kg)   BMI 32.78 kg/m  Gen:  alert, not ill-appearing, no distress, appropriate for age, obese male HEENT: head normocephalic without obvious abnormality, conjunctiva and cornea clear, trachea midline Pulm: Normal work of breathing, normal phonation, clear to auscultation bilaterally, no wheezes, rales or rhonchi CV: Normal rate, regular rhythm, s1 and s2 distinct, no murmurs, clicks or rubs  Neuro: alert and oriented x 3, no tremor MSK: extremities atraumatic, normal gait and station, no peripheral edema Skin: intact, no rashes on exposed skin, no jaundice, no cyanosis Psych: well-groomed, cooperative, good eye contact, euthymic mood, affect mood-congruent, speech is articulate, and thought processes  clear and goal-directed    No results found for this or any previous visit (from the past 72 hour(s)). No results found.    Assessment and Plan: 57 y.o. male with   1. Hypertension goal BP (blood pressure) < 130/80 BP Readings from Last 3 Encounters:  03/29/17 133/88  01/10/17 136/86  09/29/16 (!) 141/99  - BP out of range. Maximizing ARB dose - amLODipine (NORVASC) 10 MG tablet; Take 1 tablet (10 mg total) by mouth daily.  Dispense: 90 tablet; Refill: 1 - losartan (COZAAR) 100 MG tablet; Take 1 tablet (100 mg total) by mouth daily.  Dispense: 90 tablet; Refill: 1  2. Class 1 obesity due to excess calories with serious comorbidity in adult, unspecified BMI Wt Readings from Last 3 Encounters:  03/29/17 222 lb (100.7 kg)  01/10/17 224 lb (101.6 kg)  09/29/16 217 lb (98.4 kg)  - counseled on weight loss through decreased caloric intake and increased aerobic exercise - 1800 calorie moderate protein diet   3. OSA (obstructive sleep apnea) - recently diagnosed at the [place]. They will be managing his CPAP  4. Microcytic anemia - he did not complete the stool cards to assess for heme positive stool and he lost the cards. He was provided with another kit today - Iron, TIBC and Ferritin Panel - CBC with Differential/Platelet  5. Dyspepsia - stopping Omeprazole and Ranitidine. No benefit of PPI + H2RA. Switching to high-dose Protonix. I am requesting his EGD report. Questioning presence of duodenal ulcer given his history of aneima. - pantoprazole (PROTONIX) 40 MG tablet; Take 1 tablet (40 mg total) by mouth 2 (two) times daily.  Dispense: 60 tablet; Refill: 1  6. Colicky RUQ abdominal pain, Gallbladder sludge - recommend ERCP. Since his care is split between the [place] and [facility] system, he will need to contact his [place] to have this test ordered - pantoprazole (PROTONIX) 40 MG tablet; Take 1 tablet (40 mg total) by mouth 2 (two) times daily.  Dispense: 60 tablet; Refill:  1   Patient education and anticipatory guidance given Patient agrees with treatment plan Follow-up in 3 months or sooner as needed if symptoms worsen or fail to improve  Darlyne Russian PA-C

## 2017-03-29 NOTE — Patient Instructions (Addendum)
For your blood pressure: - Goal <130/80 - baby aspirin 81 mg daily to help prevent heart attack/stroke - monitor and log blood pressures at home - check around the same time each day in a relaxed setting - Limit salt to <2000 mg/day - Follow DASH eating plan - limit alcohol to 2 standard drinks per day for men and 1 per day for women - avoid tobacco products - weight loss: 7% of current body weight - follow-up every 6 months for your blood pressure   For weight loss: - 1800 calorie diet - try to get 30-35% of calories from protein (135g-157g) - 30% from fat - 35% from carbohydrates  For your abdominal pain/reflux: - stop Ranitidine. There is no benefit of taking both Ranitidine (an H2RA) and Omeprazole (a PPI) - I'm going to switch you to Protonix twice a day - I think you need a test called an ERCP to examine your bile ducts. A GI doctor needs to order this. So you would need to contact your GI doctor at the [facility] or go see our GI specialist in [place]. I can write a letter to your GI doctor at the [facility] if you give me the contact info

## 2017-03-30 LAB — CBC WITH DIFFERENTIAL/PLATELET
Basophils Absolute: 31 cells/uL (ref 0–200)
Basophils Relative: 0.7 %
Eosinophils Absolute: 189 cells/uL (ref 15–500)
Eosinophils Relative: 4.3 %
HCT: 41.3 % (ref 38.5–50.0)
Hemoglobin: 13.8 g/dL (ref 13.2–17.1)
Lymphs Abs: 1311 cells/uL (ref 850–3900)
MCH: 26.4 pg — ABNORMAL LOW (ref 27.0–33.0)
MCHC: 33.4 g/dL (ref 32.0–36.0)
MCV: 79.1 fL — ABNORMAL LOW (ref 80.0–100.0)
MPV: 10 fL (ref 7.5–12.5)
Monocytes Relative: 11.1 %
Neutro Abs: 2380 cells/uL (ref 1500–7800)
Neutrophils Relative %: 54.1 %
Platelets: 366 10*3/uL (ref 140–400)
RBC: 5.22 10*6/uL (ref 4.20–5.80)
RDW: 14.2 % (ref 11.0–15.0)
Total Lymphocyte: 29.8 %
WBC mixed population: 488 cells/uL (ref 200–950)
WBC: 4.4 10*3/uL (ref 3.8–10.8)

## 2017-03-30 LAB — IRON,TIBC AND FERRITIN PANEL
%SAT: 29 % (calc) (ref 15–60)
Ferritin: 136 ng/mL (ref 20–380)
Iron: 102 ug/dL (ref 50–180)
TIBC: 353 mcg/dL (calc) (ref 250–425)

## 2017-03-30 NOTE — Progress Notes (Signed)
Hi Todd Payne,  Your labs look much better. Anemia has resolved and iron stores are normal. I am wondering if you had a little bleeding ulcer that was healed by the Omeprazole. Either way, this is good news. I still think we need more testing to investigate that right upper abdominal pain. Let me know if you want me to contact [facility].  Best, Vinetta Bergamoharley

## 2017-04-02 DIAGNOSIS — H524 Presbyopia: Secondary | ICD-10-CM | POA: Diagnosis not present

## 2017-04-02 DIAGNOSIS — H5213 Myopia, bilateral: Secondary | ICD-10-CM | POA: Diagnosis not present

## 2017-04-06 DIAGNOSIS — G4733 Obstructive sleep apnea (adult) (pediatric): Secondary | ICD-10-CM | POA: Diagnosis not present

## 2017-04-06 DIAGNOSIS — Z713 Dietary counseling and surveillance: Secondary | ICD-10-CM | POA: Diagnosis not present

## 2017-04-06 DIAGNOSIS — I10 Essential (primary) hypertension: Secondary | ICD-10-CM | POA: Diagnosis not present

## 2017-04-06 DIAGNOSIS — E669 Obesity, unspecified: Secondary | ICD-10-CM | POA: Diagnosis not present

## 2017-04-06 DIAGNOSIS — K219 Gastro-esophageal reflux disease without esophagitis: Secondary | ICD-10-CM | POA: Diagnosis not present

## 2017-04-12 ENCOUNTER — Other Ambulatory Visit: Payer: Self-pay | Admitting: Physician Assistant

## 2017-04-12 DIAGNOSIS — I10 Essential (primary) hypertension: Secondary | ICD-10-CM

## 2017-04-26 ENCOUNTER — Other Ambulatory Visit: Payer: Self-pay | Admitting: Physician Assistant

## 2017-04-26 DIAGNOSIS — R1013 Epigastric pain: Secondary | ICD-10-CM

## 2017-04-26 DIAGNOSIS — R1011 Right upper quadrant pain: Secondary | ICD-10-CM

## 2017-05-10 DIAGNOSIS — M19011 Primary osteoarthritis, right shoulder: Secondary | ICD-10-CM | POA: Diagnosis not present

## 2017-05-10 DIAGNOSIS — Z23 Encounter for immunization: Secondary | ICD-10-CM | POA: Diagnosis not present

## 2017-05-10 DIAGNOSIS — R7303 Prediabetes: Secondary | ICD-10-CM | POA: Diagnosis not present

## 2017-05-10 DIAGNOSIS — M19012 Primary osteoarthritis, left shoulder: Secondary | ICD-10-CM | POA: Diagnosis not present

## 2017-05-10 DIAGNOSIS — M170 Bilateral primary osteoarthritis of knee: Secondary | ICD-10-CM | POA: Diagnosis not present

## 2017-05-31 DIAGNOSIS — G4733 Obstructive sleep apnea (adult) (pediatric): Secondary | ICD-10-CM | POA: Diagnosis not present

## 2017-06-07 DIAGNOSIS — K080 Exfoliation of teeth due to systemic causes: Secondary | ICD-10-CM | POA: Diagnosis not present

## 2017-06-19 ENCOUNTER — Ambulatory Visit (INDEPENDENT_AMBULATORY_CARE_PROVIDER_SITE_OTHER): Payer: Federal, State, Local not specified - PPO | Admitting: Physician Assistant

## 2017-06-19 ENCOUNTER — Encounter: Payer: Self-pay | Admitting: Physician Assistant

## 2017-06-19 VITALS — BP 141/97 | HR 80 | Temp 98.4°F | Wt 210.0 lb

## 2017-06-19 DIAGNOSIS — J22 Unspecified acute lower respiratory infection: Secondary | ICD-10-CM | POA: Diagnosis not present

## 2017-06-19 DIAGNOSIS — Z8701 Personal history of pneumonia (recurrent): Secondary | ICD-10-CM | POA: Insufficient documentation

## 2017-06-19 MED ORDER — PREDNISONE 20 MG PO TABS: 40.0000 mg | ORAL_TABLET | Freq: Every day | ORAL | 0 refills | Status: AC

## 2017-06-19 MED ORDER — AZITHROMYCIN 250 MG PO TABS
ORAL_TABLET | ORAL | 0 refills | Status: DC
Start: 2017-06-19 — End: 2021-09-30

## 2017-06-19 MED ORDER — ALBUTEROL SULFATE HFA 108 (90 BASE) MCG/ACT IN AERS: 1.0000 | INHALATION_SPRAY | RESPIRATORY_TRACT | 0 refills | Status: DC | PRN

## 2017-06-19 MED ORDER — GUAIFENESIN ER 600 MG PO TB12: 1200.0000 mg | ORAL_TABLET | Freq: Two times a day (BID) | ORAL | Status: DC

## 2017-06-19 NOTE — Patient Instructions (Signed)

## 2017-06-19 NOTE — Progress Notes (Signed)
HPI:                                                                Todd Payne is a 57 y.o. male who presents to [facility] today for cough and congestion  .Marland Kitchen  Cough  This is a new problem. The current episode started in the past 7 days. The problem has been unchanged. The problem occurs every few minutes. The cough is productive of sputum. Associated symptoms include chills, postnasal drip, rhinorrhea and a sore throat. Pertinent negatives include no fever, hemoptysis, shortness of breath or wheezing. Nothing aggravates the symptoms. He has tried rest (cough drops) for the symptoms. The treatment provided no relief. His past medical history is significant for pneumonia.    Past Medical History:  Diagnosis Date  . Alcohol abuse   . GERD (gastroesophageal reflux disease)   . Hypertension   . Microcytic anemia 01/14/2017  . Osteoarthritis of right shoulder   . Pneumonia 08/2016  . PTSD (post-traumatic stress disorder)    Past Surgical History:  Procedure Laterality Date  . APPENDECTOMY    . KNEE SURGERY Bilateral   . MENISCUS REPAIR     Social History   Tobacco Use  . Smoking status: Never Smoker  . Smokeless tobacco: Never Used  Substance Use Topics  . Alcohol use: Yes    Alcohol/week: 25.2 oz    Types: 21 Cans of beer, 21 Shots of liquor per week   family history includes Diabetes in his mother.    ROS: negative except as noted in the HPI  Medications: Current Outpatient Medications  Medication Sig Dispense Refill  . amLODipine (NORVASC) 10 MG tablet Take 1 tablet (10 mg total) by mouth daily. 90 tablet 1  . diclofenac sodium (VOLTAREN) 1 % GEL Apply 2 g topically 4 (four) times daily. To affected joint. 100 g 2  . losartan (COZAAR) 100 MG tablet Take 1 tablet (100 mg total) by mouth daily. 90 tablet 1  . losartan (COZAAR) 50 MG tablet TAKE 1 TABLET BY MOUTH EVERY DAY 90 tablet 0  . pantoprazole (PROTONIX) 40  MG tablet TAKE 1 TABLET BY MOUTH TWICE A DAY 180 tablet 0  . albuterol (PROVENTIL HFA;VENTOLIN HFA) 108 (90 Base) MCG/ACT inhaler Inhale 1-2 puffs into the lungs every 4 (four) hours as needed for wheezing or shortness of breath (bronchospasm). 1 Inhaler 0  . azithromycin (ZITHROMAX Z-PAK) 250 MG tablet Take 2 tablets (500 mg) on  Day 1,  followed by 1 tablet (250 mg) once daily on Days 2 through 5. 6 tablet 0  . guaiFENesin (MUCINEX) 600 MG 12 hr tablet Take 2 tablets (1,200 mg total) by mouth 2 (two) times daily.    . predniSONE (DELTASONE) 20 MG tablet Take 2 tablets (40 mg total) by mouth daily with breakfast for 5 days. 10 tablet 0   No current facility-administered medications for this visit.    Allergies  Allergen Reactions  . Ace Inhibitors Cough  . Benzonatate Hives       Objective:  BP (!) 141/97   Pulse 80   Temp 98.4 F (36.9 C) (Oral)   Wt 210 lb (95.3 kg)   SpO2 98%   BMI 31.01 kg/m  Gen:  alert, not ill-appearing, no distress, appropriate for age HEENT: head normocephalic without obvious abnormality, conjunctiva and cornea clear, TM's pearly gray and semi-transparent, nasal mucosa edematous, oropharynx with erythema, no edema or exudates, uvula midline, neck supple, no cervical adenopathy, trachea midline Pulm: Normal work of breathing, normal phonation, bronchitic cough, coarse breath sounds at the bases bilaterally CV: Normal rate, regular rhythm, s1 and s2 distinct, no murmurs, clicks or rubs  Neuro: alert and oriented x 3, no tremor MSK: extremities atraumatic, normal gait and station Skin: intact, no rashes on exposed skin, no jaundice, no cyanosis  No results found for this or any previous visit (from the past 72 hour(s)). No results found.    Assessment and Plan: 57 y.o. male with   Acute lower respiratory infection - Plan: predniSONE (DELTASONE) 20 MG tablet, azithromycin (ZITHROMAX Z-PAK) 250 MG tablet, albuterol (PROVENTIL HFA;VENTOLIN HFA) 108 (90  Base) MCG/ACT inhaler, guaiFENesin (MUCINEX) 600 MG 12 hr tablet  History of pneumonia  - afebrile, no tachypnea, no tachycardia, SpO2 98% on RA, coarse lung sounds. Given recent history of pneumonia, will cover for CAP with Azithromycin. This is likely a viral bronchitis however. Counseled on symptomatic care - steroid burst, bronchodilator, and expectorant.  Patient education and anticipatory guidance given Patient agrees with treatment plan Follow-up as needed if symptoms worsen or fail to improve  Levonne Hubertharley E. Avilene Marrin PA-C

## 2017-06-20 ENCOUNTER — Encounter: Payer: Self-pay | Admitting: Physician Assistant

## 2017-06-29 ENCOUNTER — Ambulatory Visit: Payer: Federal, State, Local not specified - PPO | Admitting: Physician Assistant

## 2017-07-02 ENCOUNTER — Ambulatory Visit: Payer: Federal, State, Local not specified - PPO | Admitting: Physician Assistant

## 2017-08-15 DIAGNOSIS — K080 Exfoliation of teeth due to systemic causes: Secondary | ICD-10-CM | POA: Diagnosis not present

## 2017-10-10 DIAGNOSIS — Z23 Encounter for immunization: Secondary | ICD-10-CM | POA: Diagnosis not present

## 2017-10-10 IMAGING — DX DG CHEST 2V
2 series · 2 of 2 positions shown · non-contrast
Comparison: None.

CLINICAL DATA: 55-year-old male with illness and fever for 6 days.
Cough, congestion, pressure. Fever of 101 today. Abnormal bilateral
lung auscultation.

EXAM:
CHEST  2 VIEW

[chest pa]
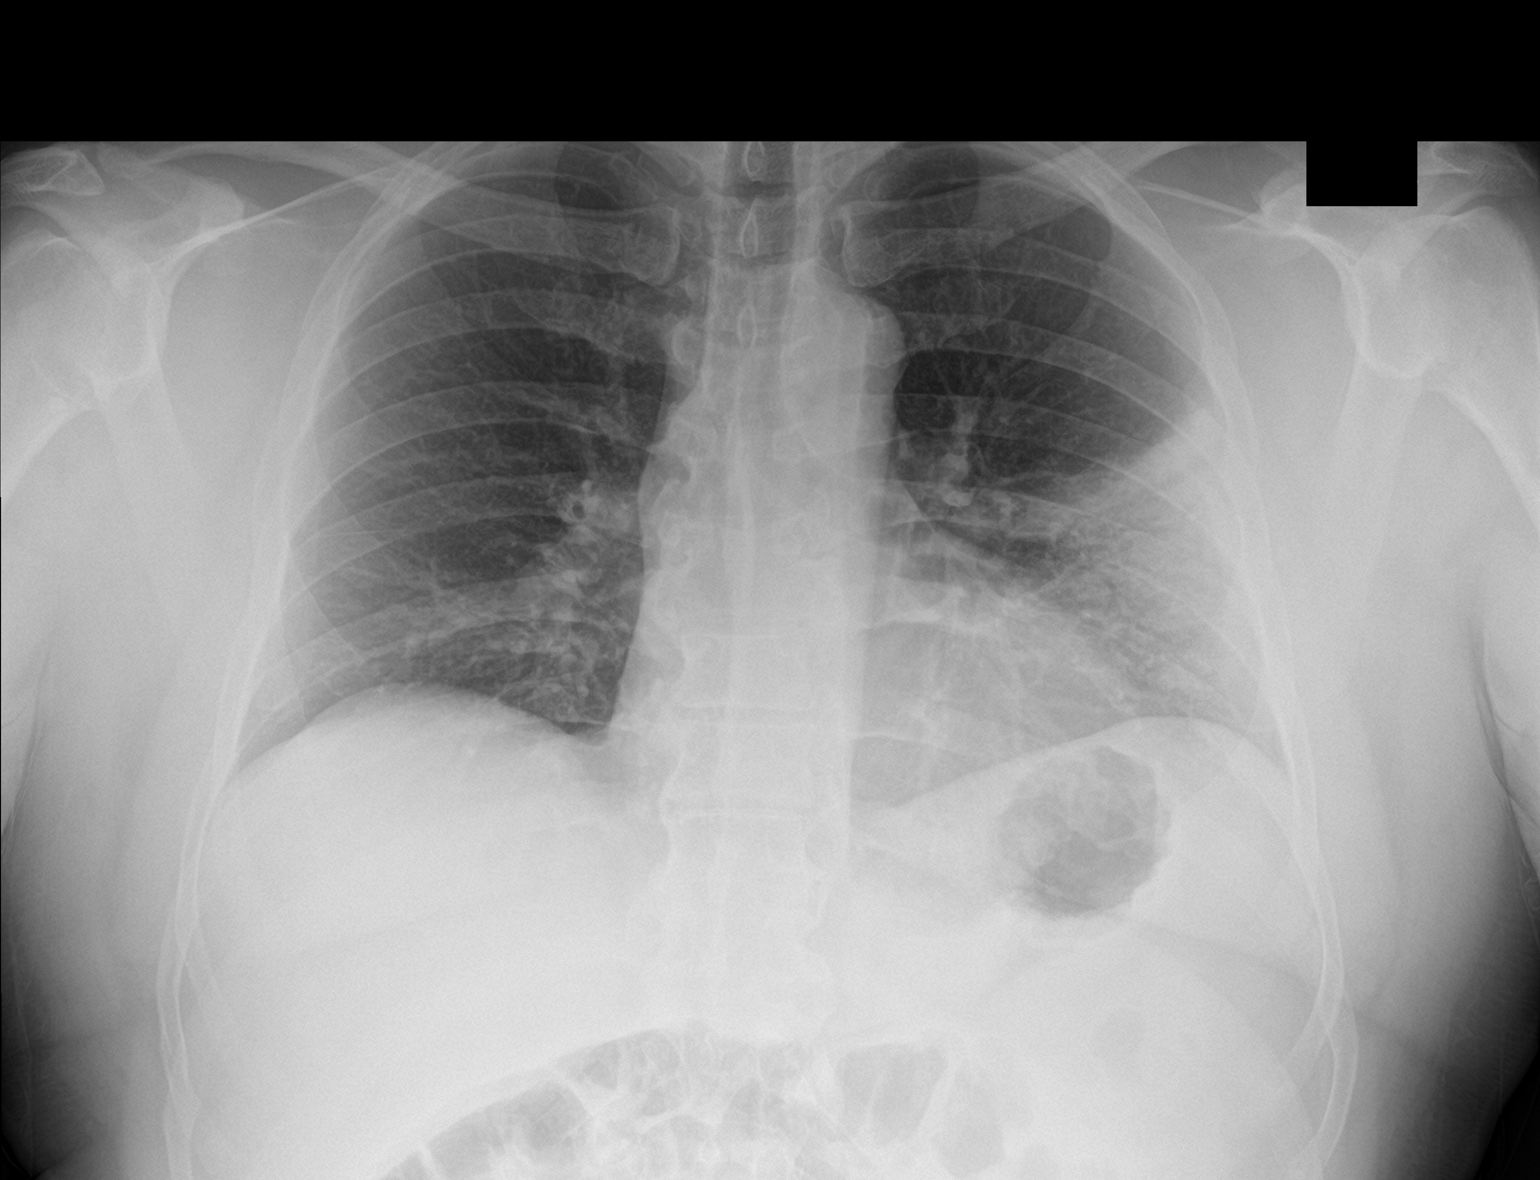

[chest lat]
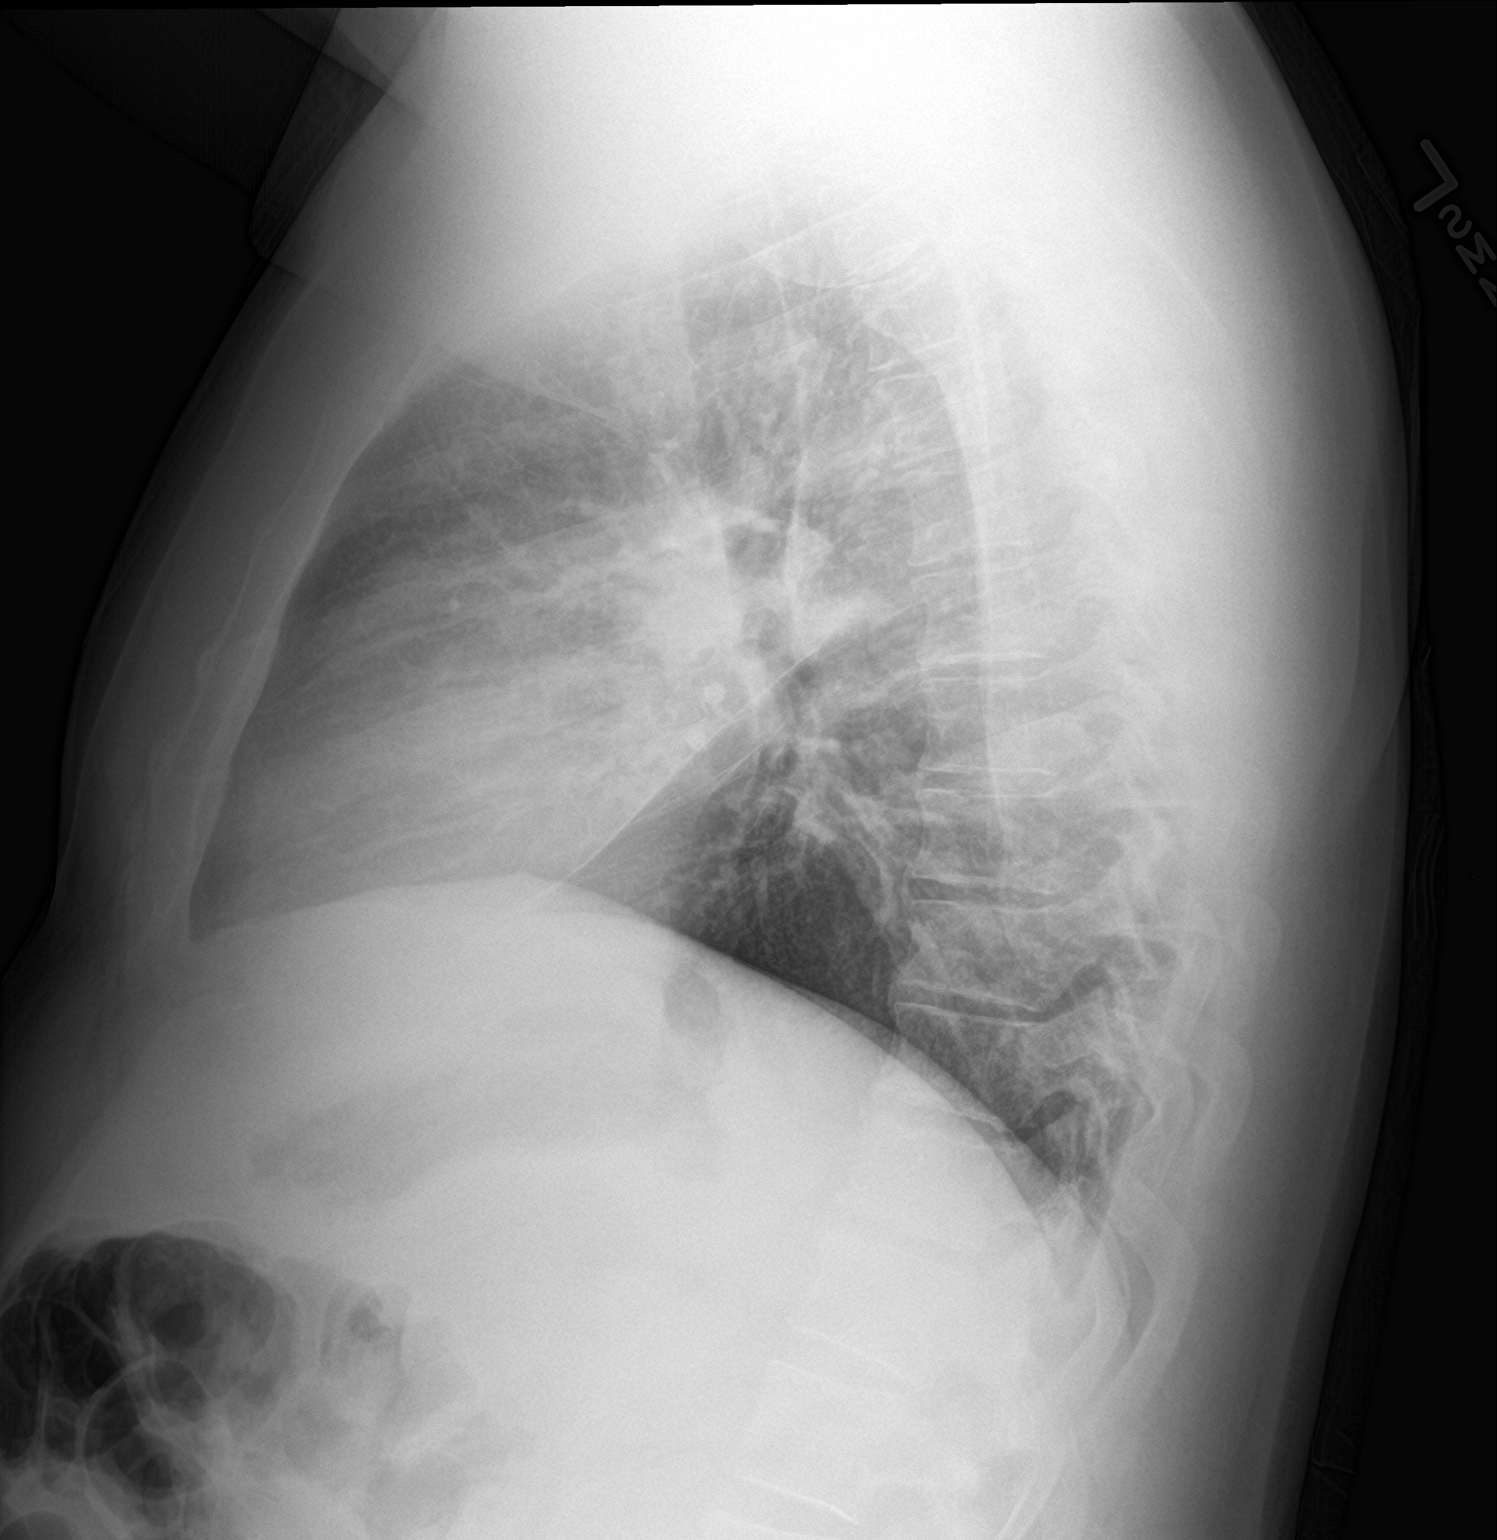

[2 of 2 positions shown; findings below may reference images not displayed]

FINDINGS: Lobar consolidation in the lingula and likely also a portion of the
inferior left upper lobe. Air bronchograms. No pleural effusion.

Normal cardiac size and mediastinal contours. Visualized tracheal
air column is within normal limits. The right lung appears negative.
No pneumothorax or pulmonary edema. Negative visible bowel gas
pattern. No acute osseous abnormality identified.
IMPRESSION: Lobar consolidation in the lingula, and likely also the inferior
left upper lobe, is compatible with Multilobar Pneumonia in this
clinical setting.

No pleural effusion.

Top etiologies would include Streptococcus, Klebsiella, Legionella,
H influenza.

Followup PA and lateral chest X-ray is recommended in 3-4 weeks
following trial of antibiotic therapy to ensure resolution and
exclude underlying malignancy.

These results will be called to the ordering clinician or
representative by the Radiologist Assistant, and communication
documented in the PACS or zVision Dashboard.

## 2017-11-04 ENCOUNTER — Other Ambulatory Visit: Payer: Self-pay | Admitting: Physician Assistant

## 2017-11-04 DIAGNOSIS — I10 Essential (primary) hypertension: Secondary | ICD-10-CM

## 2017-11-07 DIAGNOSIS — Z114 Encounter for screening for human immunodeficiency virus [HIV]: Secondary | ICD-10-CM | POA: Diagnosis not present

## 2017-11-07 DIAGNOSIS — E785 Hyperlipidemia, unspecified: Secondary | ICD-10-CM | POA: Diagnosis not present

## 2017-11-07 DIAGNOSIS — R7303 Prediabetes: Secondary | ICD-10-CM | POA: Diagnosis not present

## 2017-11-07 DIAGNOSIS — Z0184 Encounter for antibody response examination: Secondary | ICD-10-CM | POA: Diagnosis not present

## 2017-11-07 DIAGNOSIS — M170 Bilateral primary osteoarthritis of knee: Secondary | ICD-10-CM | POA: Diagnosis not present

## 2017-11-28 DIAGNOSIS — M170 Bilateral primary osteoarthritis of knee: Secondary | ICD-10-CM | POA: Diagnosis not present

## 2017-11-28 IMAGING — DX DG CHEST 2V
2 series · 2 of 2 positions shown · non-contrast
Comparison: 08/11/2016

CLINICAL DATA: Follow-up pneumonia

EXAM:
CHEST  2 VIEW

[chest pa]
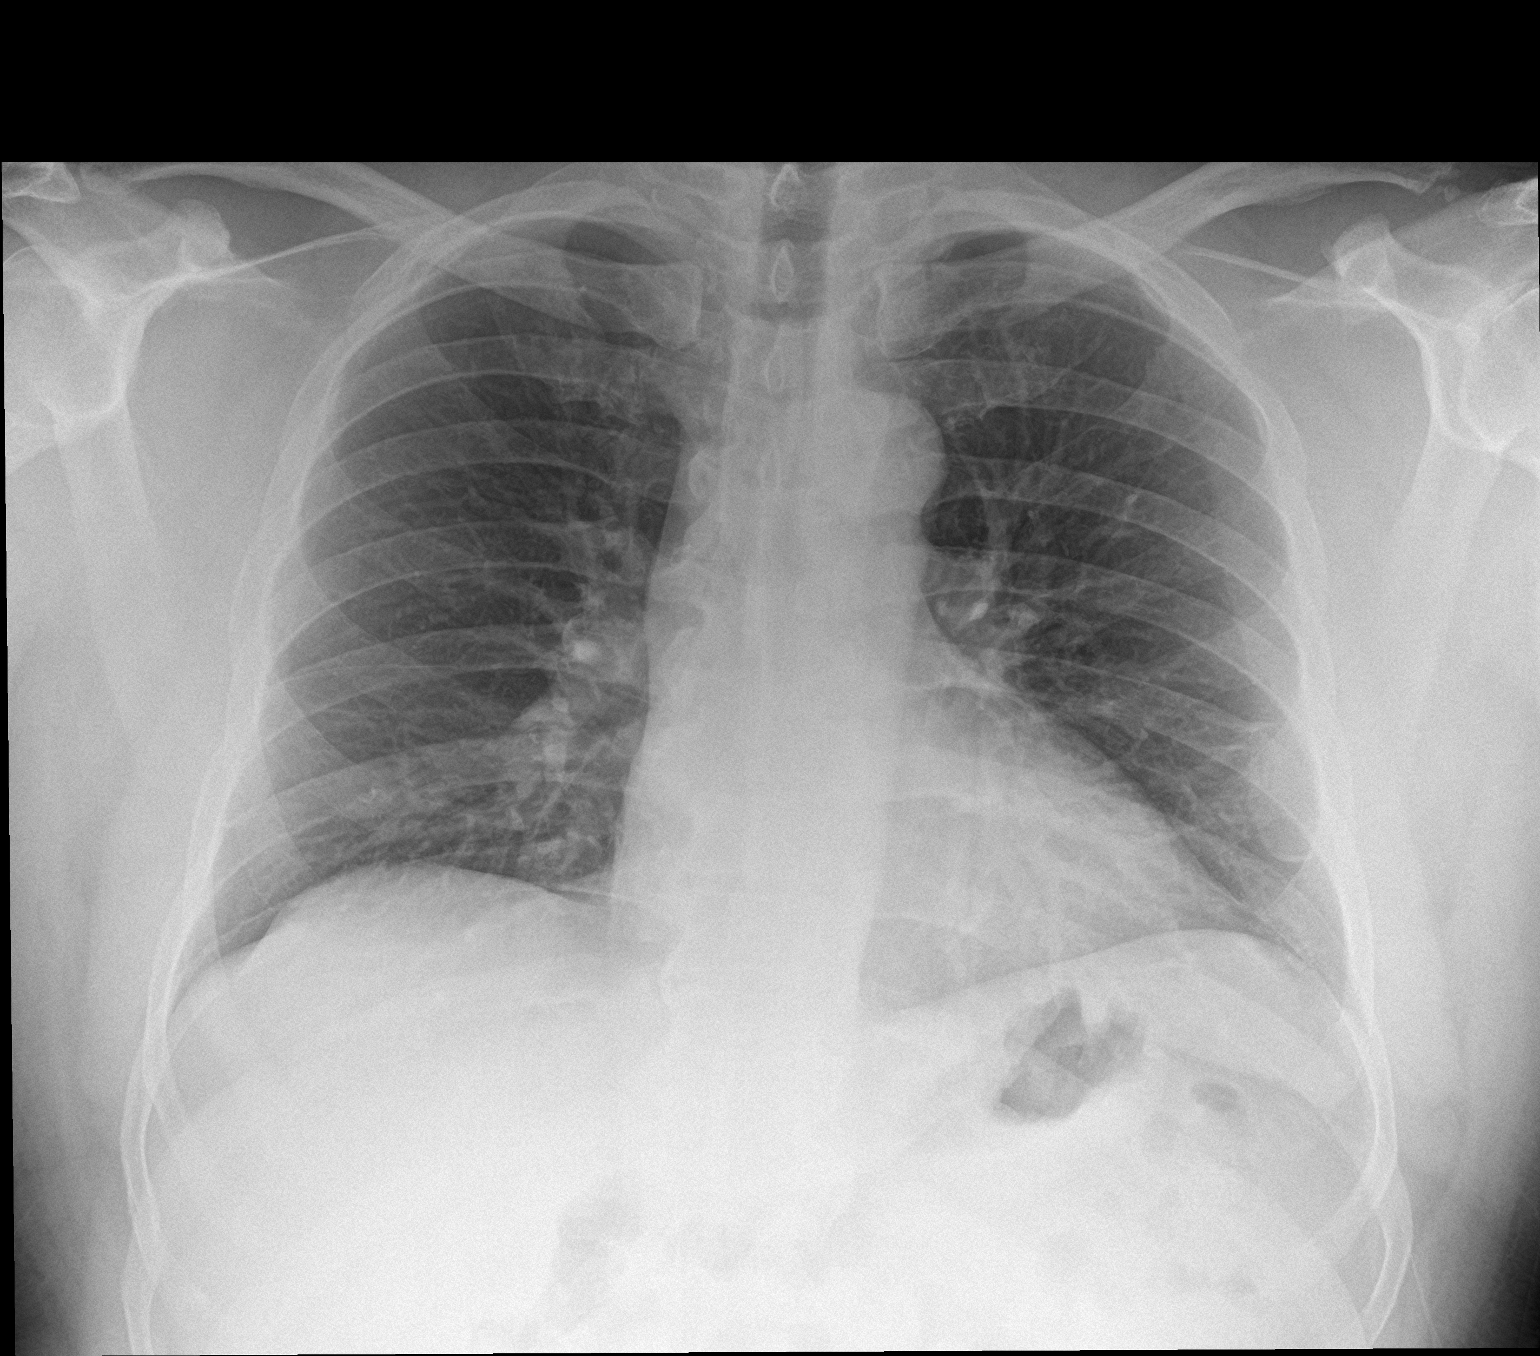

[chest lat]
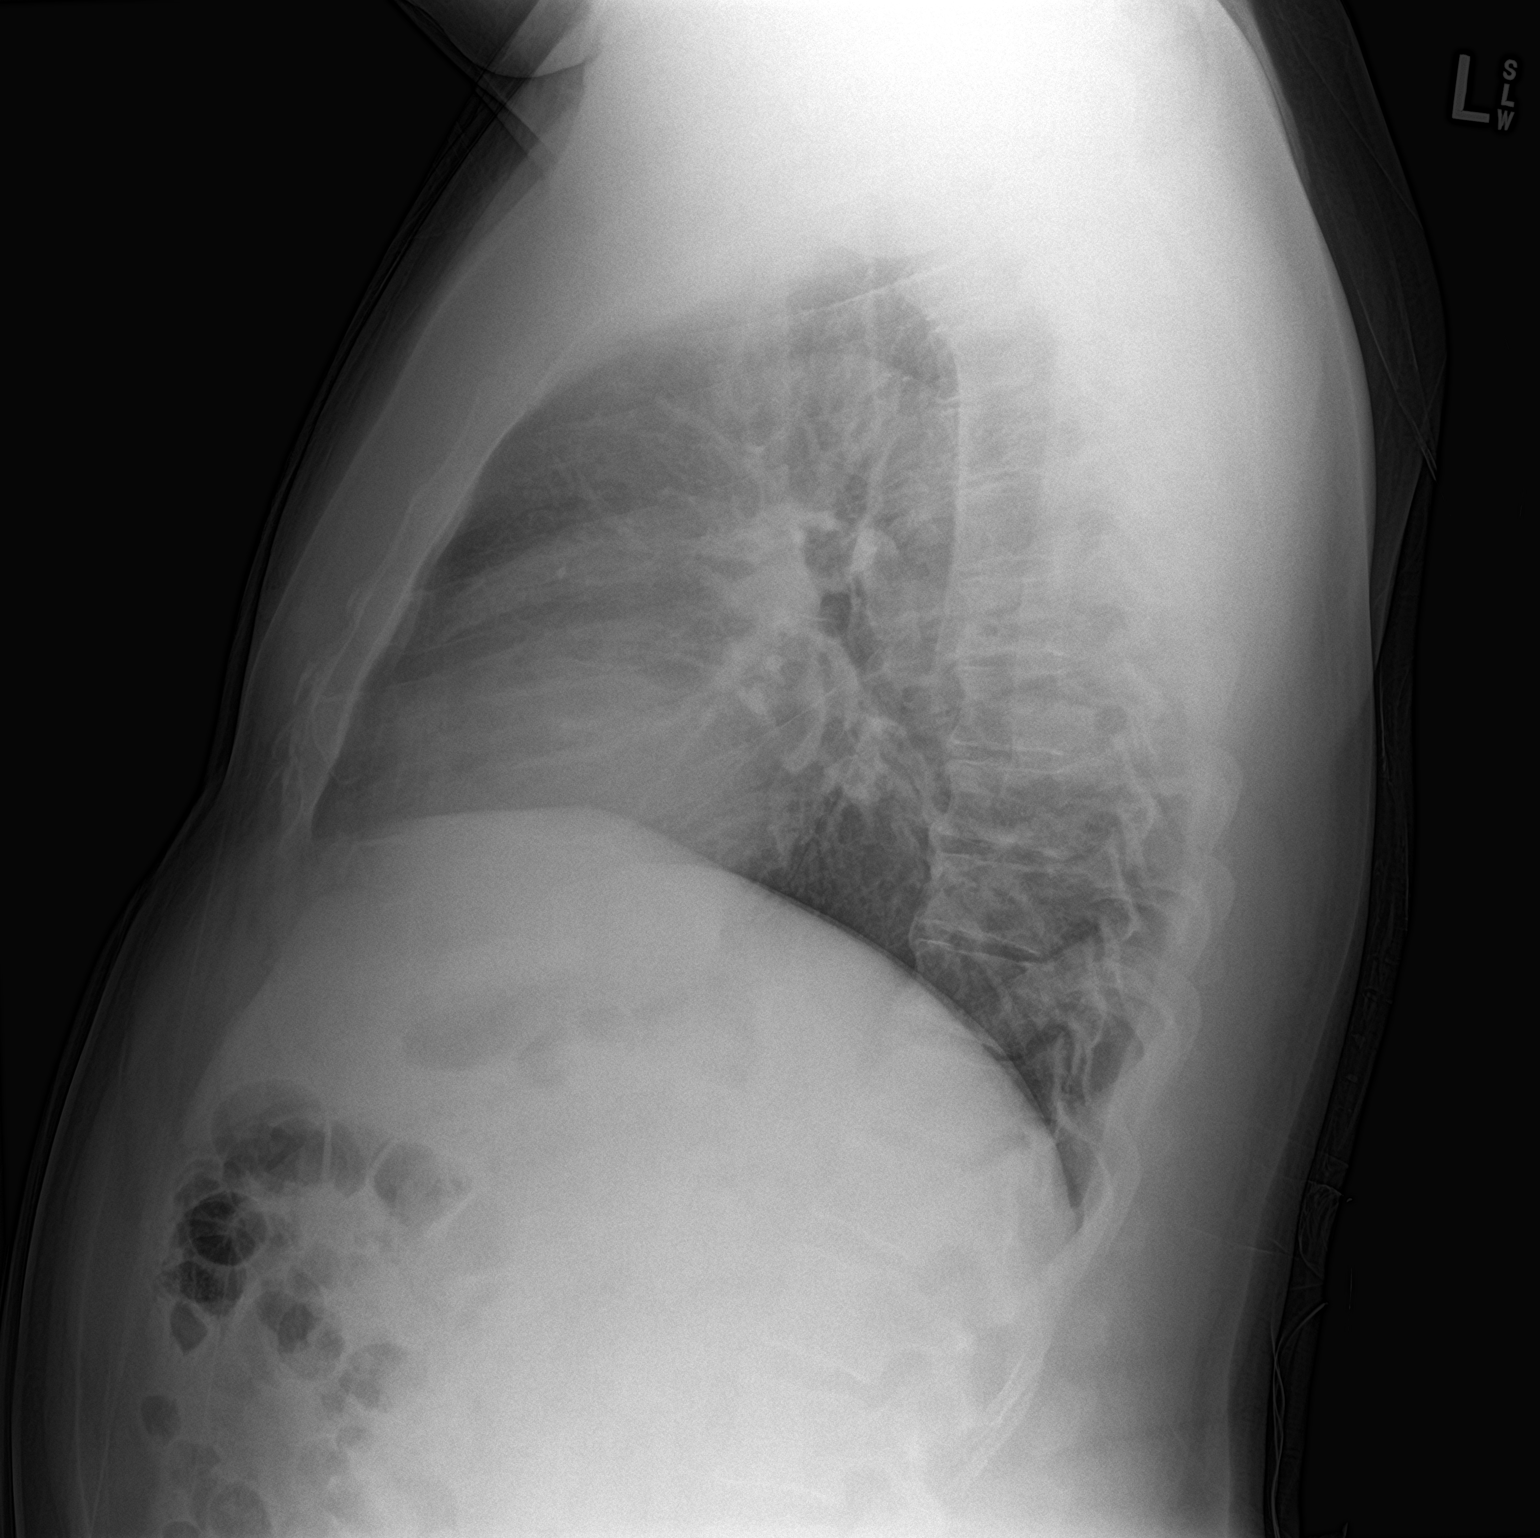

[2 of 2 positions shown; findings below may reference images not displayed]

FINDINGS: Cardiac shadow is stable. The lungs are well aerated bilaterally.
Previously seen lingular infiltrate has resolved in the interval. No
effusion is seen. No bony abnormality is noted.
IMPRESSION: Resolution of previously seen left sided pneumonia.

## 2017-12-03 DIAGNOSIS — M170 Bilateral primary osteoarthritis of knee: Secondary | ICD-10-CM | POA: Diagnosis not present

## 2020-09-01 DIAGNOSIS — R7303 Prediabetes: Secondary | ICD-10-CM | POA: Diagnosis not present

## 2020-09-01 DIAGNOSIS — I10 Essential (primary) hypertension: Secondary | ICD-10-CM | POA: Diagnosis not present

## 2020-09-01 DIAGNOSIS — H524 Presbyopia: Secondary | ICD-10-CM | POA: Diagnosis not present

## 2020-09-01 DIAGNOSIS — H2513 Age-related nuclear cataract, bilateral: Secondary | ICD-10-CM | POA: Diagnosis not present

## 2020-10-31 DIAGNOSIS — G4733 Obstructive sleep apnea (adult) (pediatric): Secondary | ICD-10-CM | POA: Diagnosis not present

## 2020-12-08 DIAGNOSIS — F4310 Post-traumatic stress disorder, unspecified: Secondary | ICD-10-CM | POA: Diagnosis not present

## 2020-12-08 DIAGNOSIS — I10 Essential (primary) hypertension: Secondary | ICD-10-CM | POA: Diagnosis not present

## 2020-12-08 DIAGNOSIS — Z23 Encounter for immunization: Secondary | ICD-10-CM | POA: Diagnosis not present

## 2020-12-08 DIAGNOSIS — F1010 Alcohol abuse, uncomplicated: Secondary | ICD-10-CM | POA: Diagnosis not present

## 2020-12-09 DIAGNOSIS — Z1211 Encounter for screening for malignant neoplasm of colon: Secondary | ICD-10-CM | POA: Diagnosis not present

## 2020-12-09 DIAGNOSIS — I10 Essential (primary) hypertension: Secondary | ICD-10-CM | POA: Diagnosis not present

## 2020-12-09 DIAGNOSIS — Z0189 Encounter for other specified special examinations: Secondary | ICD-10-CM | POA: Diagnosis not present

## 2020-12-09 DIAGNOSIS — M25561 Pain in right knee: Secondary | ICD-10-CM | POA: Diagnosis not present

## 2020-12-09 DIAGNOSIS — R7303 Prediabetes: Secondary | ICD-10-CM | POA: Diagnosis not present

## 2020-12-09 DIAGNOSIS — K219 Gastro-esophageal reflux disease without esophagitis: Secondary | ICD-10-CM | POA: Diagnosis not present

## 2020-12-14 DIAGNOSIS — M25561 Pain in right knee: Secondary | ICD-10-CM | POA: Diagnosis not present

## 2020-12-21 DIAGNOSIS — M25561 Pain in right knee: Secondary | ICD-10-CM | POA: Diagnosis not present

## 2020-12-24 DIAGNOSIS — H524 Presbyopia: Secondary | ICD-10-CM | POA: Diagnosis not present

## 2020-12-28 DIAGNOSIS — M25561 Pain in right knee: Secondary | ICD-10-CM | POA: Diagnosis not present

## 2021-01-07 DIAGNOSIS — M25552 Pain in left hip: Secondary | ICD-10-CM | POA: Diagnosis not present

## 2021-03-16 DIAGNOSIS — Z0120 Encounter for dental examination and cleaning without abnormal findings: Secondary | ICD-10-CM | POA: Diagnosis not present

## 2021-05-17 DIAGNOSIS — M25461 Effusion, right knee: Secondary | ICD-10-CM | POA: Diagnosis not present

## 2021-05-17 DIAGNOSIS — M25551 Pain in right hip: Secondary | ICD-10-CM | POA: Diagnosis not present

## 2021-05-17 DIAGNOSIS — M1711 Unilateral primary osteoarthritis, right knee: Secondary | ICD-10-CM | POA: Diagnosis not present

## 2021-05-27 DIAGNOSIS — R7303 Prediabetes: Secondary | ICD-10-CM | POA: Diagnosis not present

## 2021-05-27 DIAGNOSIS — Z23 Encounter for immunization: Secondary | ICD-10-CM | POA: Diagnosis not present

## 2021-05-27 DIAGNOSIS — N529 Male erectile dysfunction, unspecified: Secondary | ICD-10-CM | POA: Diagnosis not present

## 2021-05-27 DIAGNOSIS — I10 Essential (primary) hypertension: Secondary | ICD-10-CM | POA: Diagnosis not present

## 2021-08-25 DIAGNOSIS — M25561 Pain in right knee: Secondary | ICD-10-CM | POA: Diagnosis not present

## 2021-08-25 DIAGNOSIS — F4310 Post-traumatic stress disorder, unspecified: Secondary | ICD-10-CM | POA: Diagnosis not present

## 2021-08-25 DIAGNOSIS — F1010 Alcohol abuse, uncomplicated: Secondary | ICD-10-CM | POA: Diagnosis not present

## 2021-08-25 DIAGNOSIS — I10 Essential (primary) hypertension: Secondary | ICD-10-CM | POA: Diagnosis not present

## 2021-09-02 DIAGNOSIS — H2513 Age-related nuclear cataract, bilateral: Secondary | ICD-10-CM | POA: Diagnosis not present

## 2021-09-02 DIAGNOSIS — H524 Presbyopia: Secondary | ICD-10-CM | POA: Diagnosis not present

## 2021-09-02 DIAGNOSIS — I10 Essential (primary) hypertension: Secondary | ICD-10-CM | POA: Diagnosis not present

## 2021-09-02 DIAGNOSIS — Z8616 Personal history of COVID-19: Secondary | ICD-10-CM | POA: Diagnosis not present

## 2021-09-09 ENCOUNTER — Ambulatory Visit (INDEPENDENT_AMBULATORY_CARE_PROVIDER_SITE_OTHER): Payer: No Typology Code available for payment source | Admitting: Pulmonary Disease

## 2021-09-09 ENCOUNTER — Encounter: Payer: Self-pay | Admitting: Pulmonary Disease

## 2021-09-09 VITALS — BP 136/80 | HR 86 | Temp 98.0°F | Ht 68.0 in | Wt 214.8 lb

## 2021-09-09 DIAGNOSIS — R059 Cough, unspecified: Secondary | ICD-10-CM

## 2021-09-09 DIAGNOSIS — J984 Other disorders of lung: Secondary | ICD-10-CM | POA: Diagnosis not present

## 2021-09-09 LAB — POCT EXHALED NITRIC OXIDE: FeNO level (ppb): 13

## 2021-09-09 NOTE — Patient Instructions (Signed)
Gastroesophageal reflux disease: Try taking Pepcid over the counter Avoid fatty foods, alcohol, chocolate, caffeine and tobacco products No eating within 3 hours of bedtime  Allergic Rhinitis Use Neil Med rinses with distilled water at least twice per day using the instructions on the package. 1/2 hour after using the [facility] Med rinse, use Nasacort two puffs in each nostril once per day.  Remember that the Nasacort can take 1-2 weeks to work after regular use. Use generic claritin every day.  If this doesn't help, then stop taking it and use chlorpheniramine-phenylephrine combination tablets.  Restrictive lung disease: We will check a HRCT scan of your chest  Chronic cough You need to try to suppress your cough to allow your larynx (voice box) to heal.  For three days don't talk, laugh, sing, or clear your throat. Do everything you can to suppress the cough during this time. Use hard candies (sugarless Jolly Ranchers) or non-mint or non-menthol containing cough drops during this time to soothe your throat.  Use a cough suppressant (Delsym or what I have prescribed you) around the clock during this time.  After three days, gradually increase the use of your voice and back off on the cough suppressants.  We will see you back in 3-4 weeks or sooner if needed

## 2021-09-09 NOTE — Progress Notes (Signed)
Synopsis: Referred in August 2023 for abnormal pulmonary function test, referred by the [facility] system.  Never smoked cigarettes, smokes a cigar 1 time per month.   Subjective:   PATIENT ID: Todd Payne GENDER: male DOB: [birth year], MRN: [id number]   HPI  Chief Complaint  Patient presents with   Consult    Chronic cough, PFT review     Chronic cough: > he was recently seen by the [place] for cough and had a PFT > most of the time when he is eating, laughing or talks a lot he'll start to cough > he says that in the past he never had problems with allergies, but he thinks that the cough and allergies are worse > he took lisinopril for a while and the cough was worse, when he came off of it he improved some  He was told that he may have asthma by his son's pediatrician that he may have asthma. > he doesn't get short of breath with regular activity like cutting the grass.   > he says that if he runs he'll get short his breath  Recurrent lung infections: > he has had bronchitis at least twice a year, sometimes three times a year > he has had pneumonia before, had it in 2019 > he stopped taking his antiacid therapy 2 years ago due to concerns over memory loss.  He says that his acid reflux used to be really bad, had a lot of chest pain, heart.   He says that in the last 10-12 years his allergies may have been getting worse: > typically first thing in the mornigns he has mucus that builds up in his throat > he notes some runny eyes > some sinus congestion > he has tried claritin for a week > can't tell much of a difference > he tried flonase, didn't help much with the mornign cough  He had COVID last month, had all vaccines and boosters.    Record review: Primary care visit from September 08, 2021 from the [facility] reviewed where the patient was seen for hypertension, PTSD, alcohol abuse, ongoing tobacco use.  Past Medical History:  Diagnosis Date   Alcohol  abuse    Community acquired pneumonia of left lung 08/25/2016   GERD (gastroesophageal reflux disease)    Hypertension    Microcytic anemia 01/14/2017   Osteoarthritis of right shoulder    Pneumonia 08/2016   PTSD (post-traumatic stress disorder)      Family History  Problem Relation Age of Onset   Diabetes Mother    Colon cancer Neg Hx    Prostate cancer Neg Hx      Social History   Socioeconomic History   Marital status: Married    Spouse name: Not on file   Number of children: Not on file   Years of education: Not on file   Highest education level: Not on file  Occupational History   Not on file  Tobacco Use   Smoking status: Some Days    Types: Cigars   Smokeless tobacco: Never   Tobacco comments:    1 cigar a month 09/09/29 [facility]   Vaping Use   Vaping Use: Never used  Substance and Sexual Activity   Alcohol use: Yes    Alcohol/week: 42.0 standard drinks of alcohol    Types: 21 Cans of beer, 21 Shots of liquor per week   Drug use: No   Sexual activity: Yes  Other Topics Concern  Not on file  Social History Narrative   Not on file   Social Determinants of Health   Financial Resource Strain: Not on file  Food Insecurity: Not on file  Transportation Needs: Not on file  Physical Activity: Not on file  Stress: Not on file  Social Connections: Not on file  Intimate Partner Violence: Not on file     Allergies  Allergen Reactions   Ace Inhibitors Cough   Benzonatate Hives     Outpatient Medications Prior to Visit  Medication Sig Dispense Refill   amLODipine (NORVASC) 10 MG tablet Take 1 tablet (10 mg total) by mouth daily. 90 tablet 1   azithromycin (ZITHROMAX Z-PAK) 250 MG tablet Take 2 tablets (500 mg) on  Day 1,  followed by 1 tablet (250 mg) once daily on Days 2 through 5. 6 tablet 0   diclofenac sodium (VOLTAREN) 1 % GEL Apply 2 g topically 4 (four) times daily. To affected joint. 100 g 2   losartan (COZAAR) 100 MG tablet TAKE 1 TABLET BY MOUTH EVERY DAY  90 tablet 1   albuterol (PROVENTIL HFA;VENTOLIN HFA) 108 (90 Base) MCG/ACT inhaler Inhale 1-2 puffs into the lungs every 4 (four) hours as needed for wheezing or shortness of breath (bronchospasm). 1 Inhaler 0   guaiFENesin (MUCINEX) 600 MG 12 hr tablet Take 2 tablets (1,200 mg total) by mouth 2 (two) times daily. (Patient not taking: Reported on 09/09/2021)     losartan (COZAAR) 50 MG tablet TAKE 1 TABLET BY MOUTH EVERY DAY (Patient not taking: Reported on 09/09/2021) 90 tablet 0   pantoprazole (PROTONIX) 40 MG tablet TAKE 1 TABLET BY MOUTH TWICE A DAY (Patient not taking: Reported on 09/09/2021) 180 tablet 0   No facility-administered medications prior to visit.    ROS Gen: Denies fever, chills, weight change, fatigue, night sweats HEENT: Denies blurred vision, double vision, hearing loss, tinnitus, + sinus congestion, rhinorrhea, sore throat, neck stiffness, dysphagia PULM: per HPI CV: Denies chest pain, edema, orthopnea, paroxysmal nocturnal dyspnea, palpitations GI: Denies abdominal pain, nausea, vomiting, diarrhea, hematochezia, melena, constipation, change in bowel habits GU: Denies dysuria, hematuria, polyuria, oliguria, urethral discharge Endocrine: Denies hot or cold intolerance, polyuria, polyphagia or appetite change Derm: Denies rash, dry skin, scaling or peeling skin change Heme: Denies easy bruising, bleeding, bleeding gums Neuro: Denies headache, numbness, weakness, slurred speech, loss of memory or consciousness    Objective:  Physical Exam   Vitals:   09/09/21 1027  BP: 136/80  Pulse: 86  Temp: 98 F (36.7 C)  TempSrc: Oral  SpO2: 98%  Weight: 214 lb 12.8 oz (97.4 kg)  Height: 5\' 8"  (1.727 m)   RA  Gen: well appearing, no acute distress HENT: NCAT, OP clear, neck supple without masses Eyes: PERRL, EOMi Lymph: no cervical lymphadenopathy PULM: CTA B CV: RRR, no mgr, no JVD GI: BS+, soft, nontender, no hsm Derm: no rash or skin breakdown MSK: normal bulk and  tone Neuro: A&Ox4, CN II-XII intact, strength 5/5 in all 4 extremities Psyche: normal mood and affect   CBC    Component Value Date/Time   WBC 4.4 03/29/2017 0942   RBC 5.22 03/29/2017 0942   HGB 13.8 03/29/2017 0942   HCT 41.3 03/29/2017 0942   PLT 366 03/29/2017 0942   MCV 79.1 (L) 03/29/2017 0942   MCH 26.4 (L) 03/29/2017 0942   MCHC 33.4 03/29/2017 0942   RDW 14.2 03/29/2017 0942   LYMPHSABS 1,311 03/29/2017 0942   EOSABS 189 03/29/2017 0942  BASOSABS 31 03/29/2017 [id number]     Chest imaging: September 2018 chest x-ray images independently reviewed showing normal pulmonary parenchyma, slight enlargement of LV size July 14, 2021 chest x-ray report from the [place] shows normal pulmonary parenchyma.  PFT: July 2023 PFT interpretation from [place] [facility] system: Mild restriction, mild reduction in diffusion capacity, normal spirometry.  Ratio 79%, FVC 2.83 L l 75% predicted, total lung capacity 4.36 L 63.5% predicted, DLCO 18.6 to 69% predicted, ERV not measured, September 09, 2021 FENO 13 ppb  Labs:  Path:  Echo:  Heart Catheterization:  Immunizations: COVID-19 Alphonsa Overall), VECTOR-NR, RS* 1 04/05/2019 [facility]*  COVID-19 (MODERNA), MRNA, LNP-S,* 1 12/08/2020 [place]*  INFLUENZA, INJECTABLE, QUADRIVALE* 12/08/2020 [place]* 10/10/2017 [place]* <C>  INFLUENZA, SEASONAL, INJECTABLE 10/18/2016 [facility]*  PNEUMOCOCCAL CONJUGATE PCV20, POL* 05/27/2021 [place]*  PNEUMOCOCCAL POLYSACCHARIDE PPV23 05/10/2017 [place]*  TDAP 09/19/2015 [facility]*  ZOSTER RECOMBINANT 06/03/2020 [facility] 03/20/2019 [facility]     Assessment & Plan:   Cough, unspecified type - Plan: POCT EXHALED NITRIC OXIDE  Restrictive lung disease - Plan: CT Chest High Resolution  Discussion: The most likely cause for Todd Payne cough is untreated gastroesophageal reflux disease and ongoing rhinitis from allergic rhinitis.  He has restrictive lung disease with preserved DLCO which is most likely due  to his increased weight.  However, restriction is actually moderate (not mild as per the [place] interpretation) so I cannot rule out underlying lung disease.  Plan: Gastroesophageal reflux disease: Try taking Pepcid over the counter Avoid fatty foods, alcohol, chocolate, caffeine and tobacco products No eating within 3 hours of bedtime  Allergic Rhinitis Use Neil Med rinses with distilled water at least twice per day using the instructions on the package. 1/2 hour after using the [facility] Med rinse, use Nasacort two puffs in each nostril once per day.  Remember that the Nasacort can take 1-2 weeks to work after regular use. Use generic claritin every day.  If this doesn't help, then stop taking it and use chlorpheniramine-phenylephrine combination tablets.  Restrictive lung disease: We will check a HRCT scan of your chest  Chronic cough You need to try to suppress your cough to allow your larynx (voice box) to heal.  For three days don't talk, laugh, sing, or clear your throat. Do everything you can to suppress the cough during this time. Use hard candies (sugarless Jolly Ranchers) or non-mint or non-menthol containing cough drops during this time to soothe your throat.  Use a cough suppressant (Delsym or what I have prescribed you) around the clock during this time.  After three days, gradually increase the use of your voice and back off on the cough suppressants.  We will see you back in 3-4 weeks or sooner if needed    Current Outpatient Medications:    amLODipine (NORVASC) 10 MG tablet, Take 1 tablet (10 mg total) by mouth daily., Disp: 90 tablet, Rfl: 1   azithromycin (ZITHROMAX Z-PAK) 250 MG tablet, Take 2 tablets (500 mg) on  Day 1,  followed by 1 tablet (250 mg) once daily on Days 2 through 5., Disp: 6 tablet, Rfl: 0   diclofenac sodium (VOLTAREN) 1 % GEL, Apply 2 g topically 4 (four) times daily. To affected joint., Disp: 100 g, Rfl: 2   losartan (COZAAR) 100 MG tablet, TAKE 1 TABLET BY  MOUTH EVERY DAY, Disp: 90 tablet, Rfl: 1   albuterol (PROVENTIL HFA;VENTOLIN HFA) 108 (90 Base) MCG/ACT inhaler, Inhale 1-2 puffs into the lungs every 4 (four) hours as needed for  wheezing or shortness of breath (bronchospasm)., Disp: 1 Inhaler, Rfl: 0   guaiFENesin (MUCINEX) 600 MG 12 hr tablet, Take 2 tablets (1,200 mg total) by mouth 2 (two) times daily. (Patient not taking: Reported on 09/09/2021), Disp: , Rfl:    losartan (COZAAR) 50 MG tablet, TAKE 1 TABLET BY MOUTH EVERY DAY (Patient not taking: Reported on 09/09/2021), Disp: 90 tablet, Rfl: 0   pantoprazole (PROTONIX) 40 MG tablet, TAKE 1 TABLET BY MOUTH TWICE A DAY (Patient not taking: Reported on 09/09/2021), Disp: 180 tablet, Rfl: 0

## 2021-09-27 NOTE — Progress Notes (Signed)
Synopsis: Referred in August 2023 for abnormal pulmonary function test, referred by the [facility] system.  Never smoked cigarettes, smokes a cigar 1 time per month.   Subjective:   PATIENT ID: Todd Payne GENDER: male DOB: Apr 17, 1960, MRN: [id number]   HPI  Chief Complaint  Patient presents with   Follow-up    F/U on cough. States his cough has improved since last visit.    Valeriano is supposed to have his HRCT on Friday of next week. His cough is much better since starting allergic rhinitis and GERD treatment.   He still has a little cough in the mornings when he has some residual postnasal drip but in general this is improved significantly He says that he would like to start using his CPAP machine again but its been over a year since he last used it.  His last CPAP titration study was over 5 years ago Last visit seen for chronic cough, GERD and allergic rhinitis treatment recommended, high-res CT ordered for moderate restrictive lung disease.  Recommended Pepcid, NeilMed, antihistamines  Past Medical History:  Diagnosis Date   Alcohol abuse    Community acquired pneumonia of left lung 08/25/2016   GERD (gastroesophageal reflux disease)    Hypertension    Microcytic anemia 01/14/2017   Osteoarthritis of right shoulder    Pneumonia 08/2016   PTSD (post-traumatic stress disorder)        ROS Gen: Denies fever, chills, weight change, fatigue, night sweats HEENT: Denies blurred vision, double vision, hearing loss, tinnitus, + sinus congestion, rhinorrhea, sore throat, neck stiffness, dysphagia PULM: per HPI CV: Denies chest pain, edema, orthopnea, paroxysmal nocturnal dyspnea, palpitations     Objective:  Physical Exam   Vitals:   09/30/21 1406  BP: 126/84  Pulse: 84  SpO2: 99%  Weight: 215 lb (97.5 kg)  Height: 5\' 8"  (1.727 m)    RA  Gen: well appearing HENT: OP clear, TM's clear, neck supple PULM: CTA B, normal percussion CV: RRR, no mgr, trace  edema GI: BS+, soft, nontender Derm: no cyanosis or rash Psyche: normal mood and affect    CBC    Component Value Date/Time   WBC 4.4 03/29/2017 0942   RBC 5.22 03/29/2017 0942   HGB 13.8 03/29/2017 0942   HCT 41.3 03/29/2017 0942   PLT 366 03/29/2017 0942   MCV 79.1 (L) 03/29/2017 0942   MCH 26.4 (L) 03/29/2017 0942   MCHC 33.4 03/29/2017 0942   RDW 14.2 03/29/2017 0942   LYMPHSABS 1,311 03/29/2017 0942   EOSABS 189 03/29/2017 0942   BASOSABS 31 03/29/2017 0942     Chest imaging: September 2018 chest x-ray images independently reviewed showing normal pulmonary parenchyma, slight enlargement of LV size July 14, 2021 chest x-ray report from the July 16, 2021 shows normal pulmonary parenchyma.  PFT: July 2023 PFT interpretation from [facility] health system: Mild restriction, mild reduction in diffusion capacity, normal spirometry.  Ratio 79%, FVC 2.83 L l 75% predicted, total lung capacity 4.36 L 63.5% predicted, DLCO 18.6 to 69% predicted, ERV not measured, September 09, 2021 FENO 13 ppb  Labs:  Path:  Echo:  Heart Catheterization:      Assessment & Plan:   Cough, unspecified type  Restrictive lung disease  Gastroesophageal reflux disease, unspecified whether esophagitis present  Allergic rhinitis, unspecified seasonality, unspecified trigger  Discussion: His cough is improved significantly with treatment of acid reflux and allergic rhinitis.  I explained to him that if he gets a cough or cold that  sometimes the cough can get worse and he needs to make sure he stays on top of treating these underlying conditions to prevent the cough from lasting a long time.  He has obstructive sleep apnea and would like to start using CPAP again.  He says that he has been told he has severe disease.  He has not used his CPAP in over a year and he still having some daytime sleepiness.  Plan: Chronic cough: This is due to underlying gastroesophageal reflux disease and allergic rhinitis I will  follow-up the results of the high-resolution CT scan of your chest and call you with those results  Gastroesophageal reflux disease: Continue Pepcid over-the-counter as directed Avoid fatty foods, alcohol, chocolate, caffeine, mint, tobacco products No eating within 3 hours of bedtime  Allergic rhinitis: Use Neil Med rinses with distilled water at least twice per day using the instructions on the package. 1/2 hour after using the [facility] Med rinse, use Nasacort two puffs in each nostril once per day.  Remember that the Nasacort can take 1-2 weeks to work after regular use. Use generic zyrtec (cetirizine) every day.  If this doesn't help, then stop taking it and use chlorpheniramine-phenylephrine combination tablets.  Obstructive sleep apnea We will order a CPAP titration study  We will make arrangements for follow-up visit with one of our nurse practitioners after the CPAP titration study to see how you are doing with the new dose of CPAP.  Immunization History  Administered Date(s) Administered   Influenza Split 10/18/2016   Influenza,inj,Quad PF,6+ Mos 02/04/2020, 12/08/2020   Influenza-Unspecified 11/24/2005, 10/10/2006, 11/09/2012, 10/09/2017, 10/21/2018   Janssen (J&J) SARS-COV-2 Vaccination 04/05/2019   Moderna Covid-19 Vaccine Bivalent Booster 7yrs & up 12/08/2020   Moderna Sars-Covid-2 Vaccination 11/12/2019   PNEUMOCOCCAL CONJUGATE-20 05/27/2021   Pneumococcal Polysaccharide-23 05/10/2017   Tdap 02/20/2013, 09/10/2014, 09/19/2015, 09/29/2016   Zoster Recombinat (Shingrix) 03/20/2019, 06/04/2019, 06/03/2020     Current Outpatient Medications:    albuterol (PROVENTIL HFA;VENTOLIN HFA) 108 (90 Base) MCG/ACT inhaler, Inhale 1-2 puffs into the lungs every 4 (four) hours as needed for wheezing or shortness of breath (bronchospasm)., Disp: 1 Inhaler, Rfl: 0   amLODipine (NORVASC) 10 MG tablet, Take 1 tablet (10 mg total) by mouth daily., Disp: 90 tablet, Rfl: 1   diclofenac sodium  (VOLTAREN) 1 % GEL, Apply 2 g topically 4 (four) times daily. To affected joint., Disp: 100 g, Rfl: 2   losartan (COZAAR) 100 MG tablet, TAKE 1 TABLET BY MOUTH EVERY DAY, Disp: 90 tablet, Rfl: 1

## 2021-09-30 ENCOUNTER — Encounter: Payer: Self-pay | Admitting: Pulmonary Disease

## 2021-09-30 ENCOUNTER — Ambulatory Visit (INDEPENDENT_AMBULATORY_CARE_PROVIDER_SITE_OTHER): Payer: No Typology Code available for payment source | Admitting: Pulmonary Disease

## 2021-09-30 VITALS — BP 126/84 | HR 84 | Ht 68.0 in | Wt 215.0 lb

## 2021-09-30 DIAGNOSIS — J309 Allergic rhinitis, unspecified: Secondary | ICD-10-CM

## 2021-09-30 DIAGNOSIS — R059 Cough, unspecified: Secondary | ICD-10-CM

## 2021-09-30 DIAGNOSIS — J984 Other disorders of lung: Secondary | ICD-10-CM | POA: Diagnosis not present

## 2021-09-30 DIAGNOSIS — K219 Gastro-esophageal reflux disease without esophagitis: Secondary | ICD-10-CM

## 2021-09-30 DIAGNOSIS — G4733 Obstructive sleep apnea (adult) (pediatric): Secondary | ICD-10-CM

## 2021-09-30 NOTE — Patient Instructions (Signed)
Chronic cough: This is due to underlying gastroesophageal reflux disease and allergic rhinitis I will follow-up the results of the high-resolution CT scan of your chest and call you with those results  Gastroesophageal reflux disease: Continue Pepcid over-the-counter as directed Avoid fatty foods, alcohol, chocolate, caffeine, mint, tobacco products No eating within 3 hours of bedtime  Allergic rhinitis: Use Neil Med rinses with distilled water at least twice per day using the instructions on the package. 1/2 hour after using the [facility] Med rinse, use Nasacort two puffs in each nostril once per day.  Remember that the Nasacort can take 1-2 weeks to work after regular use. Use generic zyrtec (cetirizine) every day.  If this doesn't help, then stop taking it and use chlorpheniramine-phenylephrine combination tablets.  Obstructive sleep apnea We will order a CPAP titration study  We will make arrangements for follow-up visit with one of our nurse practitioners after the CPAP titration study to see how you are doing with the new dose of CPAP.

## 2021-10-07 ENCOUNTER — Ambulatory Visit (HOSPITAL_BASED_OUTPATIENT_CLINIC_OR_DEPARTMENT_OTHER)
Admission: RE | Admit: 2021-10-07 | Discharge: 2021-10-07 | Disposition: A | Discharge status: Home / Self Care | Payer: No Typology Code available for payment source | Source: Ambulatory Visit | Attending: Pulmonary Disease | Admitting: Pulmonary Disease

## 2021-10-07 DIAGNOSIS — J984 Other disorders of lung: Secondary | ICD-10-CM | POA: Insufficient documentation

## 2021-11-04 DIAGNOSIS — R7303 Prediabetes: Secondary | ICD-10-CM | POA: Diagnosis not present

## 2021-11-04 DIAGNOSIS — Z6833 Body mass index (BMI) 33.0-33.9, adult: Secondary | ICD-10-CM | POA: Diagnosis not present

## 2021-11-04 DIAGNOSIS — Z713 Dietary counseling and surveillance: Secondary | ICD-10-CM | POA: Diagnosis not present

## 2021-11-04 DIAGNOSIS — E669 Obesity, unspecified: Secondary | ICD-10-CM | POA: Diagnosis not present

## 2021-11-23 DIAGNOSIS — J309 Allergic rhinitis, unspecified: Secondary | ICD-10-CM | POA: Diagnosis not present

## 2021-11-23 DIAGNOSIS — N529 Male erectile dysfunction, unspecified: Secondary | ICD-10-CM | POA: Diagnosis not present

## 2021-11-23 DIAGNOSIS — E669 Obesity, unspecified: Secondary | ICD-10-CM | POA: Diagnosis not present

## 2021-11-23 DIAGNOSIS — I10 Essential (primary) hypertension: Secondary | ICD-10-CM | POA: Diagnosis not present

## 2021-12-05 ENCOUNTER — Telehealth: Payer: Self-pay | Admitting: Internal Medicine

## 2021-12-06 NOTE — Telephone Encounter (Signed)
Called [facility] to speak to Noreene Larsson about pt but she was currently not at the office. Stated to have her call the office when she got there so we could speak with her about pt. Will await a return call.

## 2021-12-07 ENCOUNTER — Ambulatory Visit (HOSPITAL_BASED_OUTPATIENT_CLINIC_OR_DEPARTMENT_OTHER): Payer: No Typology Code available for payment source | Attending: Pulmonary Disease | Admitting: Internal Medicine

## 2021-12-07 VITALS — Ht 68.0 in | Wt 214.0 lb

## 2021-12-07 DIAGNOSIS — G4733 Obstructive sleep apnea (adult) (pediatric): Secondary | ICD-10-CM | POA: Diagnosis present

## 2021-12-08 NOTE — Telephone Encounter (Signed)
Called [facility]- was advised that Todd Payne does not arrive until after 12:30 - LMTCB for her.

## 2021-12-10 DIAGNOSIS — G4733 Obstructive sleep apnea (adult) (pediatric): Secondary | ICD-10-CM

## 2021-12-10 NOTE — Procedures (Signed)
   Patient Name: Todd Payne, Todd Payne Date: 12/07/2021 Gender: Male D.O.B: 1960-06-18 Age (years): 61 Referring Provider: Max Fickle Height (inches): 68 Interpreting Physician: Jetty Duhamel MD, ABSM Weight (lbs): 214 RPSGT: Armen Pickup BMI: 33 MRN: [id number] Neck Size: 16.50  CLINICAL INFORMATION The patient is referred for a CPAP titration to treat sleep apnea.  Date of NPSG, Split Night or HST: [facility] 2019  SLEEP STUDY TECHNIQUE As per the AASM Manual for the Scoring of Sleep and Associated Events v2.3 (April 2016) with a hypopnea requiring 4% desaturations.  The channels recorded and monitored were frontal, central and occipital EEG, electrooculogram (EOG), submentalis EMG (chin), nasal and oral airflow, thoracic and abdominal wall motion, anterior tibialis EMG, snore microphone, electrocardiogram, and pulse oximetry. Continuous positive airway pressure (CPAP) was initiated at the beginning of the study and titrated to treat sleep-disordered breathing.  MEDICATIONS Medications self-administered by patient taken the night of the study : none reported  TECHNICIAN COMMENTS Comments added by technician: One restroom visted Comments added by scorer: N/A RESPIRATORY PARAMETERS Optimal PAP Pressure (cm): 16 AHI at Optimal Pressure (/hr): 0 Overall Minimal O2 (%): 84.0 Supine % at Optimal Pressure (%): 46 Minimal O2 at Optimal Pressure (%): 93.0   SLEEP ARCHITECTURE The study was initiated at 9:33:59 PM and ended at 3:41:14 AM.  Sleep onset time was 11.7 minutes and the sleep efficiency was 91.6%%. The total sleep time was 336.5 minutes.  The patient spent 3.0%% of the night in stage N1 sleep, 71.2%% in stage N2 sleep, 0.9%% in stage N3 and 25% in REM.Stage REM latency was 63.0 minutes  Wake after sleep onset was 19.0. Alpha intrusion was absent. Supine sleep was 62.42%.  CARDIAC DATA The 2 lead EKG demonstrated sinus rhythm. The mean heart rate was 72.9  beats per minute. Other EKG findings include: None.  LEG MOVEMENT DATA The total Periodic Limb Movements of Sleep (PLMS) were 0. The PLMS index was 0.0. A PLMS index of <15 is considered normal in adults.  IMPRESSIONS - The optimal PAP pressure was 16 cm of water. - Moderate oxygen desaturations were observed during this titration (min O2 = 84.0%). Minimum O2 saturation on CPAP 16 was 93%. - No snoring was audible during this study. - No cardiac abnormalities were observed during this study. - Clinically significant periodic limb movements were not noted during this study. Arousals associated with PLMs were rare.  DIAGNOSIS - Obstructive Sleep Apnea (G47.33)  RECOMMENDATIONS - Trial of CPAP therapy on 16 cm H2O or autopap 10-20. - Patient used a Medium size Resmed Full Face AirFit F20 mask and heated humidification. - Be careful with alcohol, sedatives and other CNS depressants that may worsen sleep apnea and disrupt normal sleep architecture. - Sleep hygiene should be reviewed to assess factors that may improve sleep quality. - Weight management and regular exercise should be initiated or continued.  [Electronically signed] 12/10/2021 03:25 PM  Jetty Duhamel MD, ABSM Diplomate, American Board of Sleep Medicine NPI: [id number]                          Jetty Duhamel Diplomate, American Board of Sleep Medicine  ELECTRONICALLY SIGNED ON:  12/10/2021, 3:07 PM [facility] PH: [phone]   FX: [phone] ACCREDITED BY THE AMERICAN ACADEMY OF SLEEP MEDICINE

## 2021-12-28 NOTE — Telephone Encounter (Signed)
Patient completed his sleep study on 11/29. Will close encounter.

## 2022-01-04 ENCOUNTER — Telehealth: Payer: Self-pay

## 2022-01-04 NOTE — Telephone Encounter (Signed)
-----   Message from Lupita Leash, MD sent at 12/12/2021  8:29 PM EST ----- Please let the patient know that his CPAP titration study showed that he should be on CPAP autopap 10-20 titration.  Please order with Resmed 11 device. thanks ----- Message ----- From: Waymon Budge, MD Sent: 12/10/2021   3:30 PM EST To: Lupita Leash, MD

## 2022-01-04 NOTE — Telephone Encounter (Signed)
Left message for patient to call back  

## 2022-01-13 DIAGNOSIS — Z0189 Encounter for other specified special examinations: Secondary | ICD-10-CM | POA: Diagnosis not present

## 2022-04-13 NOTE — Telephone Encounter (Signed)
Please schedule patient for follow up with APP for follow up after CPAP titration study/ CPAP follow up

## 2022-04-26 ENCOUNTER — Ambulatory Visit (INDEPENDENT_AMBULATORY_CARE_PROVIDER_SITE_OTHER): Payer: No Typology Code available for payment source | Admitting: Nurse Practitioner

## 2022-04-26 ENCOUNTER — Encounter: Payer: Self-pay | Admitting: Nurse Practitioner

## 2022-04-26 VITALS — BP 110/66 | HR 82 | Temp 98.1°F | Ht 68.0 in | Wt 219.0 lb

## 2022-04-26 DIAGNOSIS — I517 Cardiomegaly: Secondary | ICD-10-CM | POA: Diagnosis not present

## 2022-04-26 DIAGNOSIS — R053 Chronic cough: Secondary | ICD-10-CM | POA: Diagnosis not present

## 2022-04-26 DIAGNOSIS — G4733 Obstructive sleep apnea (adult) (pediatric): Secondary | ICD-10-CM | POA: Diagnosis not present

## 2022-04-26 DIAGNOSIS — J984 Other disorders of lung: Secondary | ICD-10-CM

## 2022-04-26 NOTE — Progress Notes (Signed)
@Patient  ID: Todd Payne, male    DOB: [birth year], 62 y.o.   MRN: [id number]  Chief Complaint  Patient presents with   Follow-up    Review sleep study    Referring provider: Rod Holler, FNP  HPI: 62 year old male, occasional cigar smoker followed for OSA on CPAP, cough and restrictive lung disease.  He was previously a patient of Dr. Ulyses Jarred and last seen in office 09/30/2021.  Past medical history significant for hypertension, GERD, PTSD.  TEST/EVENTS:  July 2023 PFT: Mild restriction, mild reduction diffusion capacity, normal spirometry.  Ratio 79%, FEV1 75%, TLC 63.5%, DLCO 69% 09/28/2021 FeNO 13 ppb 10/07/2021 HRCT chest: Heart is at the upper limits of normal in size to mildly enlarged.  No LAD.  Esophagus is mildly dilated.  No evidence of ILD.  Does have some air trapping.  Bilateral adrenal adenomas. 12/07/2021 CPAP titration >> 16 cmH2O, weight 214 pounds  09/30/2021: OV with Dr. Kendrick Fries.  Cough is improved since last visit.  Cough is much better since starting allergic rhinitis and GERD treatment.  Occasional in the morning with some residual postnasal drip but overall very improved.  He would like to restart using his CPAP machine but it has been over a year since he had lasted.  Last CPAP titration study was 5 years ago.  CPAP titration study ordered for further evaluation.  He did have previous pulmonary function testing at the [place] which showed a moderate restrictive defect and will be having high-resolution CT on Friday.  04/26/2022: Today-follow-up Patient presents today for follow-up.  Since he was here last, he had high-resolution CT scan, which did not show any abnormal findings in the lungs.  No evidence of ILD.  He did have some question of mild enlargement of the heart. Never been evaluated for this before. No cardiac symptoms per his report including chest pain, palpitations, lower extremity swelling, PND, orthopnea.  He tells me that since he was here last, his  cough has resolved.  He is actually been feeling quite well.  He has not had to be treated for any episodes of bronchitis.  Feels like his reflux measures are well-controlled.  He does feel like his allergies are stable.  The seem to have helped his cough significantly.  Denies any shortness of breath, wheezing or chest congestion. Patient has a history of severe OSA, per his report.  He does have a CPAP at home, which he uses some nights.  He has had some trouble with frequent sinus infections on previous masks.  He did have a CPAP titration study in November and has been using a fullface mask since, which seems to be working well for him.  He just thinks his machine is old and not working as well as it used to.  He is also worried it is not as clean as it should be.  He would like to see if he can get a new machine.  When he does use his CPAP, he feels well rested and sleeps well at night.  Without it, he has snoring, witnessed apneas, daytime fatigue, morning headaches and dry mouth as well as frequent night awakenings.  Denies any recent issues with drowsy driving.  He has fallen asleep many many years ago but no recent events.  Allergies  Allergen Reactions   Ace Inhibitors Cough   Benzonatate Hives    Immunization History  Administered Date(s) Administered   Influenza Split 10/18/2016   Influenza,inj,Quad PF,6+ Mos 02/04/2020, 12/08/2020  Influenza-Unspecified 11/24/2005, 10/10/2006, 11/09/2012, 10/09/2017, 10/21/2018   Janssen (J&J) SARS-COV-2 Vaccination 04/05/2019   Moderna Covid-19 Vaccine Bivalent Booster 85yrs & up 12/08/2020   Moderna Sars-Covid-2 Vaccination 11/12/2019   PNEUMOCOCCAL CONJUGATE-20 05/27/2021   Pneumococcal Polysaccharide-23 05/10/2017   Tdap 02/20/2013, 09/10/2014, 09/19/2015, 09/29/2016   Zoster Recombinat (Shingrix) 03/20/2019, 06/04/2019, 06/03/2020    Past Medical History:  Diagnosis Date   Alcohol abuse    Community acquired pneumonia of left lung  08/25/2016   GERD (gastroesophageal reflux disease)    Hypertension    Microcytic anemia 01/14/2017   Osteoarthritis of right shoulder    Pneumonia 08/2016   PTSD (post-traumatic stress disorder)     Tobacco History: Social History   Tobacco Use  Smoking Status Some Days   Types: Cigars  Smokeless Tobacco Never  Tobacco Comments   1 cigar a month 09/09/29 [facility]    Ready to quit: Not Answered Counseling given: Not Answered Tobacco comments: 1 cigar a month 09/09/29 ARJ    Outpatient Medications Prior to Visit  Medication Sig Dispense Refill   albuterol (PROVENTIL HFA;VENTOLIN HFA) 108 (90 Base) MCG/ACT inhaler Inhale 1-2 puffs into the lungs every 4 (four) hours as needed for wheezing or shortness of breath (bronchospasm). 1 Inhaler 0   amLODipine (NORVASC) 10 MG tablet Take 10 mg by mouth daily.     atorvastatin (LIPITOR) 20 MG tablet Take 20 mg by mouth daily.     chlorthalidone (HYGROTON) 25 MG tablet Take 25 mg by mouth daily.     Cholecalciferol (D3 PO) Take by mouth.     famotidine (PEPCID) 20 MG tablet Take 20 mg by mouth daily.     fluticasone (FLONASE) 50 MCG/ACT nasal spray Place 2 sprays into both nostrils daily.     loratadine (CLARITIN) 10 MG tablet Take 10 mg by mouth daily.     losartan (COZAAR) 100 MG tablet TAKE 1 TABLET BY MOUTH EVERY DAY 90 tablet 1   metformin (FORTAMET) 500 MG (OSM) 24 hr tablet Take 500 mg by mouth daily with breakfast.     sildenafil (VIAGRA) 50 MG tablet Take 50 mg by mouth as needed.     diclofenac sodium (VOLTAREN) 1 % GEL Apply 2 g topically 4 (four) times daily. To affected joint. 100 g 2   amLODipine (NORVASC) 10 MG tablet Take 1 tablet (10 mg total) by mouth daily. 90 tablet 1   No facility-administered medications prior to visit.     Review of Systems:   Constitutional: No weight loss or gain, night sweats, fevers, chills, or lassitude. +fatigue  HEENT: No headaches, difficulty swallowing, tooth/dental problems, or sore throat. No  sneezing, itching, ear ache, nasal congestion, or post nasal drip CV:  No chest pain, orthopnea, PND, swelling in lower extremities, anasarca, dizziness, palpitations, syncope Resp: +snoring. No shortness of breath with exertion. No excess mucus or change in color of mucus. No productive or non-productive. No hemoptysis. No wheezing.  No chest wall deformity GI:  No heartburn, indigestion, abdominal pain, nausea, vomiting, diarrhea, change in bowel habits, loss of appetite, bloody stools.  GU: No dysuria, change in color of urine, urgency or frequency.   Skin: No rash, lesions, ulcerations MSK:  No joint pain or swelling.   Neuro: No dizziness or lightheadedness.  Psych: No depression or anxiety. Mood stable. +sleep disturbance    Physical Exam:  BP 110/66 (BP Location: Right Arm, Patient Position: Sitting, Cuff Size: Large)   Pulse 82   Temp 98.1 F (36.7 C) (Oral)  Ht  (1.727 m)   Wt 219 lb (99.3 kg)   SpO2 97%   BMI 33.30 kg/m   GEN: Pleasant, interactive, well-appearing; obese; in no acute distress. HEENT:  Normocephalic and atraumatic. PERRLA. Sclera white. Nasal turbinates pink, moist and patent bilaterally. No rhinorrhea present. Oropharynx pink and moist, without exudate or edema. No lesions, ulcerations, or postnasal drip. Mallampati III NECK:  Supple w/ fair ROM. No JVD present. Normal carotid impulses w/o bruits. Thyroid symmetrical with no goiter or nodules palpated. No lymphadenopathy.   CV: RRR, no m/r/g, no peripheral edema. Pulses intact, +2 bilaterally. No cyanosis, pallor or clubbing. PULMONARY:  Unlabored, regular breathing. Clear bilaterally A&P w/o wheezes/rales/rhonchi. No accessory muscle use.  GI: BS present and normoactive. Soft, non-tender to palpation. No organomegaly or masses detected.  MSK: No erythema, warmth or tenderness. Cap refil <2 sec all extrem. No deformities or joint swelling noted.  Neuro: A/Ox3. No focal deficits noted.   Skin: Warm, no  lesions or rashe Psych: Normal affect and behavior. Judgement and thought content appropriate.     Lab Results:  CBC    Component Value Date/Time   WBC 4.4 03/29/2017 0942   RBC 5.22 03/29/2017 0942   HGB 13.8 03/29/2017 0942   HCT 41.3 03/29/2017 0942   PLT 366 03/29/2017 0942   MCV 79.1 (L) 03/29/2017 0942   MCH 26.4 (L) 03/29/2017 0942   MCHC 33.4 03/29/2017 0942   RDW 14.2 03/29/2017 0942   LYMPHSABS 1,311 03/29/2017 0942   EOSABS 189 03/29/2017 0942   BASOSABS 31 03/29/2017 0942    BMET    Component Value Date/Time   NA 140 01/10/2017 1356   K 4.2 01/10/2017 1356   CL 104 01/10/2017 1356   CO2 27 01/10/2017 1356   GLUCOSE 109 (H) 01/10/2017 1356   BUN 10 01/10/2017 1356   CREATININE 1.07 01/10/2017 1356   CALCIUM 9.3 01/10/2017 1356    BNP No results found for: "BNP"   Imaging:  No results found.        No data to display          No results found for: "NITRICOXIDE"      Assessment & Plan:   OSA (obstructive sleep apnea) Difficulties with nasal masks in the past resulting in suboptimal compliance. Seems to be tolerating better with change to full face mask. He receives benefit from use but machine is old; he would like a new one. Original sleep study requested. CPAP titration from November 2023 with optimal pressure setting of 16 cmH2O. Orders for new auto CPAP 10-20 cmH2O placed today. Cautioned on driving practices. Reviewed risks of untreated OSA and potential treatment options. Verbalized understanding.  Patient Instructions  Continue to use CPAP every night, increase to a minimum of 4-6 hours a night but try to use the whole night Change equipment every 30 days or as directed by DME. Wash your tubing with warm soap and water daily, hang to dry. Wash humidifier portion weekly.  Be aware of reduced alertness and do not drive or operate heavy machinery if experiencing this or drowsiness.  Notify if persistent daytime sleepiness occurs even  with consistent use of CPAP.  Orders sent for a new CPAP machine   We discussed how untreated sleep apnea puts an individual at risk for cardiac arrhthymias, pulm HTN, DM, stroke and increases their risk for daytime accidents. We also briefly reviewed treatment options including weight loss, side sleeping position, oral appliance, CPAP therapy or referral to  ENT for possible surgical options  Glad your cough has resolved!   Follow up in 12 weeks with Dr. Wynona Neat (new pt), or sooner, if needed    Restrictive lung disease No evidence of ILD. Currently asymptomatic. Likely related to obesity. Will continue to monitor symptoms and repeat testing as indicated. He did have a mild diffusion defect as well. Evidence of mild cardiomegaly on CT imaging; question relation to untreated OSA? See above plan. No symptoms of CHF/volume overload. Will refer to cardiology for further evaluation.   Chronic cough Resolved with allergic rhinitis and GERD treatments.    I spent 35 minutes of dedicated to the care of this patient on the date of this encounter to include pre-visit review of records, face-to-face time with the patient discussing conditions above, post visit ordering of testing, clinical documentation with the electronic health record, making appropriate referrals as documented, and communicating necessary findings to members of the patients care team.  Noemi Chapel, NP 04/26/2022  Pt aware and understands NP's role.

## 2022-04-26 NOTE — Assessment & Plan Note (Addendum)
Difficulties with nasal masks in the past resulting in suboptimal compliance. Seems to be tolerating better with change to full face mask. He receives benefit from use but machine is old; he would like a new one. Original sleep study requested. CPAP titration from November 2023 with optimal pressure setting of 16 cmH2O. Orders for new auto CPAP 10-20 cmH2O placed today. Cautioned on driving practices. Reviewed risks of untreated OSA and potential treatment options. Verbalized understanding.  Patient Instructions  Continue to use CPAP every night, increase to a minimum of 4-6 hours a night but try to use the whole night Change equipment every 30 days or as directed by DME. Wash your tubing with warm soap and water daily, hang to dry. Wash humidifier portion weekly.  Be aware of reduced alertness and do not drive or operate heavy machinery if experiencing this or drowsiness.  Notify if persistent daytime sleepiness occurs even with consistent use of CPAP.  Orders sent for a new CPAP machine   We discussed how untreated sleep apnea puts an individual at risk for cardiac arrhthymias, pulm HTN, DM, stroke and increases their risk for daytime accidents. We also briefly reviewed treatment options including weight loss, side sleeping position, oral appliance, CPAP therapy or referral to ENT for possible surgical options  Glad your cough has resolved!   Follow up in 12 weeks with Dr. Wynona Neat (new pt), or sooner, if needed

## 2022-04-26 NOTE — Assessment & Plan Note (Signed)
Resolved with allergic rhinitis and GERD treatments.

## 2022-04-26 NOTE — Patient Instructions (Signed)
Continue to use CPAP every night, increase to a minimum of 4-6 hours a night but try to use the whole night Change equipment every 30 days or as directed by DME. Wash your tubing with warm soap and water daily, hang to dry. Wash humidifier portion weekly.  Be aware of reduced alertness and do not drive or operate heavy machinery if experiencing this or drowsiness.  Notify if persistent daytime sleepiness occurs even with consistent use of CPAP.  Orders sent for a new CPAP machine   We discussed how untreated sleep apnea puts an individual at risk for cardiac arrhthymias, pulm HTN, DM, stroke and increases their risk for daytime accidents. We also briefly reviewed treatment options including weight loss, side sleeping position, oral appliance, CPAP therapy or referral to ENT for possible surgical options  Glad your cough has resolved!   Follow up in 12 weeks with Todd Payne (new pt), or sooner, if needed

## 2022-04-26 NOTE — Assessment & Plan Note (Addendum)
No evidence of ILD. Currently asymptomatic. Likely related to obesity. Will continue to monitor symptoms and repeat testing as indicated. He did have a mild diffusion defect as well. Evidence of mild cardiomegaly on CT imaging; question relation to untreated OSA? See above plan. No symptoms of CHF/volume overload. Will refer to cardiology for further evaluation.

## 2022-05-17 NOTE — Progress Notes (Signed)
Referring-Katherine Cobb, NP Reason for referral-cardiomegaly  HPI: 62 year old male for evaluation of cardiomegaly at request of Natalia Leatherwood, NP.  Chest CT September 2023 showed coronary calcification; mild cardiac enlargement.  Patient has dyspnea with vigorous activities but not routine activities.  No orthopnea, PND, pedal edema, exertional chest pain or syncope.  Cardiology now asked to evaluate.  Current Outpatient Medications  Medication Sig Dispense Refill   albuterol (PROVENTIL HFA;VENTOLIN HFA) 108 (90 Base) MCG/ACT inhaler Inhale 1-2 puffs into the lungs every 4 (four) hours as needed for wheezing or shortness of breath (bronchospasm). 1 Inhaler 0   amLODipine (NORVASC) 10 MG tablet Take 10 mg by mouth daily.     atorvastatin (LIPITOR) 20 MG tablet Take 20 mg by mouth daily.     chlorthalidone (HYGROTON) 25 MG tablet Take 25 mg by mouth daily.     Cholecalciferol (D3 PO) Take by mouth.     diclofenac sodium (VOLTAREN) 1 % GEL Apply 2 g topically 4 (four) times daily. To affected joint. 100 g 2   famotidine (PEPCID) 20 MG tablet Take 20 mg by mouth daily.     fluticasone (FLONASE) 50 MCG/ACT nasal spray Place 2 sprays into both nostrils daily.     loratadine (CLARITIN) 10 MG tablet Take 10 mg by mouth daily.     losartan (COZAAR) 100 MG tablet TAKE 1 TABLET BY MOUTH EVERY DAY 90 tablet 1   metformin (FORTAMET) 500 MG (OSM) 24 hr tablet Take 500 mg by mouth daily with breakfast.     sildenafil (VIAGRA) 50 MG tablet Take 50 mg by mouth as needed.     No current facility-administered medications for this visit.    Allergies  Allergen Reactions   Ace Inhibitors Cough   Benzonatate Hives     Past Medical History:  Diagnosis Date   Alcohol abuse    Community acquired pneumonia of left lung 08/25/2016   GERD (gastroesophageal reflux disease)    Hyperlipidemia    Hypertension    Microcytic anemia 01/14/2017   OSA (obstructive sleep apnea)    Osteoarthritis of right  shoulder    Pneumonia 08/2016   Prediabetes    PTSD (post-traumatic stress disorder)     Past Surgical History:  Procedure Laterality Date   APPENDECTOMY     KNEE SURGERY Bilateral    MENISCUS REPAIR      Social History   Socioeconomic History   Marital status: Married    Spouse name: Not on file   Number of children: 4   Years of education: Not on file   Highest education level: Not on file  Occupational History   Not on file  Tobacco Use   Smoking status: Some Days    Types: Cigars   Smokeless tobacco: Never   Tobacco comments:    1 cigar a month 09/09/29 ARJ   Vaping Use   Vaping Use: Never used  Substance and Sexual Activity   Alcohol use: Yes    Alcohol/week: 42.0 standard drinks of alcohol    Types: 21 Cans of beer, 21 Shots of liquor per week    Comment: Occasional   Drug use: No   Sexual activity: Yes  Other Topics Concern   Not on file  Social History Narrative   Not on file   Social Determinants of Health   Financial Resource Strain: Not on file  Food Insecurity: Not on file  Transportation Needs: Not on file  Physical Activity: Not on file  Stress:  Not on file  Social Connections: Not on file  Intimate Partner Violence: Not on file    Family History  Problem Relation Age of Onset   Diabetes Mother    Colon cancer Neg Hx    Prostate cancer Neg Hx     ROS: no fevers or chills, productive cough, hemoptysis, dysphasia, odynophagia, melena, hematochezia, dysuria, hematuria, rash, seizure activity, orthopnea, PND, pedal edema, claudication. Remaining systems are negative.  Physical Exam:   Blood pressure 116/78, pulse 82, height 5\' 7"  (1.702 m), weight 217 lb 12.8 oz (98.8 kg), SpO2 96 %.  General:  Well developed/well nourished in NAD Skin warm/dry Patient not depressed No peripheral clubbing Back-normal HEENT-normal/normal eyelids Neck supple/normal carotid upstroke bilaterally; no bruits; no JVD; no thyromegaly chest - CTA/ normal  expansion CV - RRR/normal S1 and S2; no murmurs, rubs or gallops;  PMI nondisplaced Abdomen -NT/ND, no HSM, no mass, + bowel sounds, no bruit 2+ femoral pulses, no bruits Ext-no edema, chords, 2+ DP Neuro-grossly nonfocal  ECG -normal sinus rhythm at a rate of 80, nonspecific ST changes.  Patient personally reviewed  A/P  1 cardiomegaly-mild on CT scan.  Patient has no symptoms.  Will arrange echocardiogram to assess LV function and size.  If normal we will not pursue further evaluation.  2 coronary calcification-noted on CT scan not having chest pain.  Continue statin.  3 Hypertension-blood pressure controlled.  Continue present medical regimen.  4 Obstructive sleep apnea-managed by pulmonary.  Olga Millers, MD

## 2022-05-22 DIAGNOSIS — Z0189 Encounter for other specified special examinations: Secondary | ICD-10-CM | POA: Diagnosis not present

## 2022-05-23 DIAGNOSIS — R7303 Prediabetes: Secondary | ICD-10-CM | POA: Diagnosis not present

## 2022-05-23 DIAGNOSIS — E7800 Pure hypercholesterolemia, unspecified: Secondary | ICD-10-CM | POA: Diagnosis not present

## 2022-05-23 DIAGNOSIS — I10 Essential (primary) hypertension: Secondary | ICD-10-CM | POA: Diagnosis not present

## 2022-05-23 DIAGNOSIS — Z6832 Body mass index (BMI) 32.0-32.9, adult: Secondary | ICD-10-CM | POA: Diagnosis not present

## 2022-05-23 DIAGNOSIS — E669 Obesity, unspecified: Secondary | ICD-10-CM | POA: Diagnosis not present

## 2022-05-23 DIAGNOSIS — F4310 Post-traumatic stress disorder, unspecified: Secondary | ICD-10-CM | POA: Diagnosis not present

## 2022-05-25 ENCOUNTER — Encounter: Payer: Self-pay | Admitting: Cardiology

## 2022-05-25 ENCOUNTER — Ambulatory Visit: Payer: No Typology Code available for payment source | Attending: Cardiology | Admitting: Cardiology

## 2022-05-25 VITALS — BP 116/78 | HR 82 | Ht 67.0 in | Wt 217.8 lb

## 2022-05-25 DIAGNOSIS — I517 Cardiomegaly: Secondary | ICD-10-CM

## 2022-05-25 DIAGNOSIS — I10 Essential (primary) hypertension: Secondary | ICD-10-CM

## 2022-05-25 DIAGNOSIS — I2584 Coronary atherosclerosis due to calcified coronary lesion: Secondary | ICD-10-CM

## 2022-05-25 DIAGNOSIS — I2510 Atherosclerotic heart disease of native coronary artery without angina pectoris: Secondary | ICD-10-CM

## 2022-05-25 DIAGNOSIS — E7800 Pure hypercholesterolemia, unspecified: Secondary | ICD-10-CM

## 2022-05-25 NOTE — Patient Instructions (Signed)
  Testing/Procedures:  Your physician has requested that you have an echocardiogram. Echocardiography is a painless test that uses sound waves to create images of your heart. It provides your doctor with information about the size and shape of your heart and how well your heart's chambers and valves are working. This procedure takes approximately one hour. There are no restrictions for this procedure. Please do NOT wear cologne, perfume, aftershave, or lotions (deodorant is allowed). Please arrive 15 minutes prior to your appointment time. [address]   Follow-Up: At [facility], you and your health needs are our priority.  As part of our continuing mission to provide you with exceptional heart care, we have created designated Provider Care Teams.  These Care Teams include your primary Cardiologist (physician) and Advanced Practice Providers (APPs -  Physician Assistants and Nurse Practitioners) who all work together to provide you with the care you need, when you need it.  We recommend signing up for the patient portal called "MyChart".  Sign up information is provided on this After Visit Summary.  MyChart is used to connect with patients for Virtual Visits (Telemedicine).  Patients are able to view lab/test results, encounter notes, upcoming appointments, etc.  Non-urgent messages can be sent to your provider as well.   To learn more about what you can do with MyChart, go to [email or web address].    Your next appointment:   12 month(s)  Provider:   BRIAN CRENSHAW MD    

## 2022-06-22 ENCOUNTER — Ambulatory Visit (HOSPITAL_COMMUNITY): Payer: No Typology Code available for payment source

## 2022-07-11 DIAGNOSIS — H527 Unspecified disorder of refraction: Secondary | ICD-10-CM | POA: Diagnosis not present

## 2022-07-12 ENCOUNTER — Ambulatory Visit (HOSPITAL_COMMUNITY): Payer: Federal, State, Local not specified - PPO | Attending: Cardiology

## 2022-07-12 DIAGNOSIS — I517 Cardiomegaly: Secondary | ICD-10-CM | POA: Diagnosis not present

## 2022-07-12 LAB — ECHOCARDIOGRAM COMPLETE
AV Vena cont: 0.2 cm
Area-P 1/2: 3.53 cm2
Calc EF: 55.6 %
P 1/2 time: 553 msec
S' Lateral: 3.3 cm
Single Plane A2C EF: 60.6 %
Single Plane A4C EF: 52.7 %

## 2022-09-22 DIAGNOSIS — H2513 Age-related nuclear cataract, bilateral: Secondary | ICD-10-CM | POA: Diagnosis not present

## 2022-09-22 DIAGNOSIS — H35033 Hypertensive retinopathy, bilateral: Secondary | ICD-10-CM | POA: Diagnosis not present

## 2022-09-22 DIAGNOSIS — H527 Unspecified disorder of refraction: Secondary | ICD-10-CM | POA: Diagnosis not present

## 2022-09-22 DIAGNOSIS — I10 Essential (primary) hypertension: Secondary | ICD-10-CM | POA: Diagnosis not present

## 2023-02-08 ENCOUNTER — Encounter: Payer: Self-pay | Admitting: Pulmonary Disease

## 2023-02-08 ENCOUNTER — Ambulatory Visit (INDEPENDENT_AMBULATORY_CARE_PROVIDER_SITE_OTHER): Payer: No Typology Code available for payment source | Admitting: Pulmonary Disease

## 2023-02-08 VITALS — BP 128/78 | HR 77 | Temp 97.8°F | Ht 68.0 in | Wt 220.4 lb

## 2023-02-08 DIAGNOSIS — J984 Other disorders of lung: Secondary | ICD-10-CM

## 2023-02-08 DIAGNOSIS — G4733 Obstructive sleep apnea (adult) (pediatric): Secondary | ICD-10-CM | POA: Diagnosis not present

## 2023-02-08 DIAGNOSIS — R053 Chronic cough: Secondary | ICD-10-CM

## 2023-02-08 NOTE — Patient Instructions (Addendum)
DME referral for new machine  Auto CPAP 10-20 with heated humidification with patient's mask of choice  Need for CPAP supplies  Obtain pulmonary function test at next visit  Follow-up in 3 months  Continue using medications for your reflux  Attempt to control nasal stuffiness and congestion as best as you can  Call us with significant concerns

## 2023-02-08 NOTE — Progress Notes (Signed)
Todd Payne    [id number]    May 04, 1960  Primary Care Physician:Cummings, Bernerd Pho, PA-C (Inactive)  Referring Physician: Agustina Caroli, MD [address] [place],  [place] [address]  Chief complaint:   History of obstructive sleep apnea Chronic cough  HPI:  Cough has been for many years  Does have reflux for which he takes medications Nasal stuffiness congestion for which he tries to take Flonase  Had a previous CTs showing no significant pathology  His weight is stable  Accompanied by spouse  Snoring, daytime sleepiness Has not been very compliant with his CPAP old machine, needs supplies  Outpatient Encounter Medications as of 02/08/2023  Medication Sig   amLODipine (NORVASC) 10 MG tablet Take 10 mg by mouth daily.   atorvastatin (LIPITOR) 20 MG tablet Take 20 mg by mouth daily.   chlorthalidone (HYGROTON) 25 MG tablet Take 25 mg by mouth daily.   Cholecalciferol (D3 PO) Take by mouth.   diclofenac sodium (VOLTAREN) 1 % GEL Apply 2 g topically 4 (four) times daily. To affected joint.   famotidine (PEPCID) 20 MG tablet Take 20 mg by mouth daily.   fluticasone (FLONASE) 50 MCG/ACT nasal spray Place 2 sprays into both nostrils daily.   loratadine (CLARITIN) 10 MG tablet Take 10 mg by mouth daily.   losartan (COZAAR) 100 MG tablet TAKE 1 TABLET BY MOUTH EVERY DAY   metformin (FORTAMET) 500 MG (OSM) 24 hr tablet Take 500 mg by mouth daily with breakfast.   albuterol (PROVENTIL HFA;VENTOLIN HFA) 108 (90 Base) MCG/ACT inhaler Inhale 1-2 puffs into the lungs every 4 (four) hours as needed for wheezing or shortness of breath (bronchospasm). (Patient not taking: Reported on 02/08/2023)   sildenafil (VIAGRA) 50 MG tablet Take 50 mg by mouth as needed.   No facility-administered encounter medications on file as of 02/08/2023.    Allergies as of 02/08/2023 - Review Complete 02/08/2023  Allergen Reaction Noted   Ace inhibitors Cough 08/25/2016    Benzonatate Hives 08/25/2016    Past Medical History:  Diagnosis Date   Alcohol abuse    Community acquired pneumonia of left lung 08/25/2016   GERD (gastroesophageal reflux disease)    Hyperlipidemia    Hypertension    Microcytic anemia 01/14/2017   OSA (obstructive sleep apnea)    Osteoarthritis of right shoulder    Pneumonia 08/2016   Prediabetes    PTSD (post-traumatic stress disorder)     Past Surgical History:  Procedure Laterality Date   APPENDECTOMY     KNEE SURGERY Bilateral    MENISCUS REPAIR      Family History  Problem Relation Age of Onset   Diabetes Mother    Colon cancer Neg Hx    Prostate cancer Neg Hx     Social History   Socioeconomic History   Marital status: Married    Spouse name: Not on file   Number of children: 4   Years of education: Not on file   Highest education level: Not on file  Occupational History   Not on file  Tobacco Use   Smoking status: Some Days    Types: Cigars   Smokeless tobacco: Never   Tobacco comments:    1 cigar a month 09/09/29 ARJ   Vaping Use   Vaping status: Never Used  Substance and Sexual Activity   Alcohol use: Yes    Alcohol/week: 42.0 standard drinks of alcohol    Types: 21 Cans of  beer, 21 Shots of liquor per week    Comment: Occasional   Drug use: No   Sexual activity: Yes  Other Topics Concern   Not on file  Social History Narrative   Not on file   Social Drivers of Health   Financial Resource Strain: Not on file  Food Insecurity: Not on file  Transportation Needs: Not on file  Physical Activity: Not on file  Stress: Not on file  Social Connections: Not on file  Intimate Partner Violence: Not on file    Review of Systems  Respiratory:  Positive for apnea and cough.   Psychiatric/Behavioral:  Positive for sleep disturbance.     There were no vitals filed for this visit.   Physical Exam Constitutional:      Appearance: He is obese.  HENT:     Head: Normocephalic.     Nose: Nose  normal.     Mouth/Throat:     Mouth: Mucous membranes are moist.  Eyes:     General: No scleral icterus. Cardiovascular:     Rate and Rhythm: Normal rate and regular rhythm.     Heart sounds: No murmur heard.    No friction rub.  Pulmonary:     Effort: No respiratory distress.     Breath sounds: No stridor. No wheezing or rhonchi.  Musculoskeletal:     Cervical back: No rigidity or tenderness.  Neurological:     Mental Status: He is alert.  Psychiatric:        Mood and Affect: Mood normal.      Data Reviewed: Sleep study reviewed showing it was titrated to CPAP of 16, AutoPap recommended 10-20  His initial sleep study was in the [facility], [place], 2019  Assessment:  Obstructive sleep apnea -Suboptimally treated at present  Chronic cough -Previous CT scan of the chest reviewed showing no infiltrative process -No PFT on record -No underlying history of asthma -History of GERD for which he is on medications -Does have history of nasal stuffiness, encouraged to use Flonase  Plan/Recommendations:  Obtain pulmonary function test  DME referral for new CPAP, auto CPAP 10-20  DME referral for CPAP supplies  Follow-up in 3 months   Virl Diamond MD [facility] 02/08/2023, 10:28 AM  CC: Agustina Caroli, MD

## 2023-03-26 ENCOUNTER — Telehealth: Payer: Self-pay | Admitting: Pulmonary Disease

## 2023-03-26 NOTE — Telephone Encounter (Signed)
 PT calling about CPAP machine order. He is with the [facility]. His # is [phone]

## 2023-03-30 NOTE — Telephone Encounter (Signed)
 NFN

## 2023-05-09 ENCOUNTER — Ambulatory Visit: Payer: No Typology Code available for payment source | Admitting: Pulmonary Disease

## 2023-05-10 ENCOUNTER — Ambulatory Visit (INDEPENDENT_AMBULATORY_CARE_PROVIDER_SITE_OTHER): Admitting: Pulmonary Disease

## 2023-05-10 ENCOUNTER — Encounter: Payer: Self-pay | Admitting: Pulmonary Disease

## 2023-05-10 ENCOUNTER — Telehealth: Payer: Self-pay

## 2023-05-10 VITALS — BP 130/90 | HR 83 | Ht 68.0 in | Wt 219.8 lb

## 2023-05-10 DIAGNOSIS — G4733 Obstructive sleep apnea (adult) (pediatric): Secondary | ICD-10-CM | POA: Diagnosis not present

## 2023-05-10 DIAGNOSIS — F17290 Nicotine dependence, other tobacco product, uncomplicated: Secondary | ICD-10-CM | POA: Diagnosis not present

## 2023-05-10 DIAGNOSIS — R053 Chronic cough: Secondary | ICD-10-CM

## 2023-05-10 NOTE — Telephone Encounter (Signed)
 Called pt,lmtcb to reschedule appointment

## 2023-05-10 NOTE — Progress Notes (Signed)
 Todd Payne    [id number]    1960/02/02  Primary Care Physician:Cummings, Trude Furry, PA-C (Inactive)  Referring Physician: Josepha Nickels, MD [address] [place],  [place] [address]  Chief complaint:   History of obstructive sleep apnea Chronic cough  HPI:  His cough is better  Diagnosed with mild obstructive sleep apnea Has not been able to get his machine yet  Continues on medications for reflux Continues with medications for nasal stuffiness and congestion, definitely sensitive to pollen  Weight has remained stable  His weight is stable  Accompanied by spouse-her questions were answered during the visit  Snoring, daytime sleepiness Has not been very compliant with his CPAP old machine, needs supplies  Outpatient Encounter Medications as of 05/10/2023  Medication Sig   albuterol  (PROVENTIL  HFA;VENTOLIN  HFA) 108 (90 Base) MCG/ACT inhaler Inhale 1-2 puffs into the lungs every 4 (four) hours as needed for wheezing or shortness of breath (bronchospasm). (Patient not taking: Reported on 05/10/2023)   amLODipine  (NORVASC ) 10 MG tablet Take 10 mg by mouth daily.   atorvastatin (LIPITOR) 20 MG tablet Take 20 mg by mouth daily.   chlorthalidone (HYGROTON) 25 MG tablet Take 25 mg by mouth daily.   Cholecalciferol (D3 PO) Take by mouth.   diclofenac  sodium (VOLTAREN ) 1 % GEL Apply 2 g topically 4 (four) times daily. To affected joint.   famotidine (PEPCID) 20 MG tablet Take 20 mg by mouth daily.   fluticasone  (FLONASE ) 50 MCG/ACT nasal spray Place 2 sprays into both nostrils daily.   loratadine (CLARITIN) 10 MG tablet Take 10 mg by mouth daily.   losartan  (COZAAR ) 100 MG tablet TAKE 1 TABLET BY MOUTH EVERY DAY   metformin (FORTAMET) 500 MG (OSM) 24 hr tablet Take 500 mg by mouth daily with breakfast.   sildenafil (VIAGRA) 50 MG tablet Take 50 mg by mouth as needed.   No facility-administered encounter medications on file as of 05/10/2023.    Allergies  as of 05/10/2023 - Review Complete 05/10/2023  Allergen Reaction Noted   Ace inhibitors Cough 08/25/2016   Benzonatate  Hives 08/25/2016    Past Medical History:  Diagnosis Date   Alcohol abuse    Community acquired pneumonia of left lung 08/25/2016   GERD (gastroesophageal reflux disease)    Hyperlipidemia    Hypertension    Microcytic anemia 01/14/2017   OSA (obstructive sleep apnea)    Osteoarthritis of right shoulder    Pneumonia 08/2016   Prediabetes    PTSD (post-traumatic stress disorder)     Past Surgical History:  Procedure Laterality Date   APPENDECTOMY     KNEE SURGERY Bilateral    MENISCUS REPAIR      Family History  Problem Relation Age of Onset   Diabetes Mother    Colon cancer Neg Hx    Prostate cancer Neg Hx     Social History   Socioeconomic History   Marital status: Married    Spouse name: Not on file   Number of children: 4   Years of education: Not on file   Highest education level: Not on file  Occupational History   Not on file  Tobacco Use   Smoking status: Some Days    Types: Cigars   Smokeless tobacco: Never   Tobacco comments:    1 cigar a month 09/09/29 ARJ   Vaping Use   Vaping status: Never Used  Substance and Sexual Activity   Alcohol use: Yes  Alcohol/week: 42.0 standard drinks of alcohol    Types: 21 Cans of beer, 21 Shots of liquor per week    Comment: Occasional   Drug use: No   Sexual activity: Yes  Other Topics Concern   Not on file  Social History Narrative   Not on file   Social Drivers of Health   Financial Resource Strain: Not on file  Food Insecurity: Not on file  Transportation Needs: Not on file  Physical Activity: Not on file  Stress: Not on file  Social Connections: Not on file  Intimate Partner Violence: Not on file    Review of Systems  Respiratory:  Positive for apnea and cough.   Psychiatric/Behavioral:  Positive for sleep disturbance.     There were no vitals filed for this  visit.   Physical Exam Constitutional:      Appearance: He is obese.  HENT:     Head: Normocephalic.     Nose: Nose normal.     Mouth/Throat:     Mouth: Mucous membranes are moist.  Eyes:     General: No scleral icterus. Cardiovascular:     Rate and Rhythm: Normal rate and regular rhythm.     Heart sounds: No murmur heard.    No friction rub.  Pulmonary:     Effort: No respiratory distress.     Breath sounds: No stridor. No wheezing or rhonchi.  Musculoskeletal:     Cervical back: No rigidity or tenderness.  Neurological:     Mental Status: He is alert.  Psychiatric:        Mood and Affect: Mood normal.     Data Reviewed: Sleep study reviewed showing it was titrated to CPAP of 16, AutoPap recommended 10-20  His initial sleep study was in the [facility], [place], 2019  Assessment:  Obstructive sleep apnea - Not regularly present time - Still waiting for his new machine  Chronic cough - CT chest with no infiltrative process - On current medications - Remains on Flonase  - Options of treatment for nasal stuffiness and congestion discussed including Singulair and possibly adding Astelin  Plan/Recommendations:  DME referral for CPAP - Auto CPAP 10-20 - Provided a physical prescription form to take to the [facility] for possible treatment  Encouraged to continue current management for his nasal stuffiness and congestion/cough  Follow-up in about 3 months  Encouraged to call with significant concerns   Myer Artis MD [facility] 05/10/2023, 9:54 AM  CC: Josepha Nickels, MD

## 2023-05-10 NOTE — Patient Instructions (Signed)
 Follow-up in about 3 months  Prescription for auto CPAP 10-20 with heated humidification with patient's mask of choice  Protective devices to help with allergies  Continue Flonase  Continue antihistamine

## 2023-08-07 ENCOUNTER — Telehealth: Payer: Self-pay | Admitting: Pulmonary Disease

## 2023-08-07 ENCOUNTER — Telehealth: Payer: Self-pay | Admitting: *Deleted

## 2023-08-07 NOTE — Telephone Encounter (Signed)
 PT presents to the front with a fax number he needs the CPAP RX to be sent to. It is as follows: [phone] This goes to the [facility] clinic.

## 2023-08-07 NOTE — Telephone Encounter (Signed)
 Duplicated encounter. Completing this note.

## 2023-08-07 NOTE — Telephone Encounter (Signed)
 Copied from CRM [id number]. Topic: Clinical - Order For Equipment >> Aug 07, 2023  8:12 AM Corean SAUNDERS wrote: Reason for CRM: Patient is requesting Dr. Neda to please send an order for a new CPAP machine the [facility].

## 2023-08-07 NOTE — Telephone Encounter (Signed)
 ATC X1. LMTCB

## 2023-08-07 NOTE — Telephone Encounter (Signed)
 Copied from CRM [id number]. Topic: Clinical - Medical Advice >> Aug 07, 2023  2:38 PM Isabell A wrote: Reason for CRM: Patient returning missed phone call from [place].   Callback number: [phone]  Duplicate

## 2023-08-15 ENCOUNTER — Ambulatory Visit: Admitting: Pulmonary Disease

## 2023-08-15 ENCOUNTER — Ambulatory Visit (INDEPENDENT_AMBULATORY_CARE_PROVIDER_SITE_OTHER): Admitting: Pulmonary Disease

## 2023-08-15 VITALS — BP 119/76 | HR 84 | Ht 68.0 in | Wt 218.6 lb

## 2023-08-15 DIAGNOSIS — R053 Chronic cough: Secondary | ICD-10-CM

## 2023-08-15 DIAGNOSIS — R059 Cough, unspecified: Secondary | ICD-10-CM

## 2023-08-15 DIAGNOSIS — J984 Other disorders of lung: Secondary | ICD-10-CM | POA: Diagnosis not present

## 2023-08-15 DIAGNOSIS — J309 Allergic rhinitis, unspecified: Secondary | ICD-10-CM | POA: Diagnosis not present

## 2023-08-15 DIAGNOSIS — J22 Unspecified acute lower respiratory infection: Secondary | ICD-10-CM

## 2023-08-15 DIAGNOSIS — G4733 Obstructive sleep apnea (adult) (pediatric): Secondary | ICD-10-CM

## 2023-08-15 DIAGNOSIS — K219 Gastro-esophageal reflux disease without esophagitis: Secondary | ICD-10-CM | POA: Diagnosis not present

## 2023-08-15 DIAGNOSIS — I517 Cardiomegaly: Secondary | ICD-10-CM

## 2023-08-15 LAB — PULMONARY FUNCTION TEST
DL/VA % pred: 127 %
DL/VA: 5.43 ml/min/mmHg/L
DLCO unc % pred: 95 %
DLCO unc: 24.91 ml/min/mmHg
FEF 25-75 Post: 5.04 L/s
FEF 25-75 Pre: 2.82 L/s
FEF2575-%Change-Post: 78 %
FEF2575-%Pred-Post: 180 %
FEF2575-%Pred-Pre: 101 %
FEV1-%Change-Post: 12 %
FEV1-%Pred-Post: 80 %
FEV1-%Pred-Pre: 71 %
FEV1-Post: 2.69 L
FEV1-Pre: 2.4 L
FEV1FVC-%Change-Post: 8 %
FEV1FVC-%Pred-Pre: 108 %
FEV6-%Change-Post: 4 %
FEV6-%Pred-Post: 71 %
FEV6-%Pred-Pre: 68 %
FEV6-Post: 3.03 L
FEV6-Pre: 2.91 L
FEV6FVC-%Change-Post: 0 %
FEV6FVC-%Pred-Post: 104 %
FEV6FVC-%Pred-Pre: 104 %
FVC-%Change-Post: 3 %
FVC-%Pred-Post: 68 %
FVC-%Pred-Pre: 66 %
FVC-Post: 3.03 L
FVC-Pre: 2.93 L
Post FEV1/FVC ratio: 89 %
Post FEV6/FVC ratio: 100 %
Pre FEV1/FVC ratio: 82 %
Pre FEV6/FVC Ratio: 99 %
RV % pred: 83 %
RV: 1.79 L
TLC % pred: 75 %
TLC: 4.97 L

## 2023-08-15 MED ORDER — ALBUTEROL SULFATE HFA 108 (90 BASE) MCG/ACT IN AERS: 1.0000 | INHALATION_SPRAY | RESPIRATORY_TRACT | 3 refills | Status: AC | PRN

## 2023-08-15 MED ORDER — FLUTICASONE FUROATE-VILANTEROL 100-25 MCG/ACT IN AEPB: 1.0000 | INHALATION_SPRAY | Freq: Every day | RESPIRATORY_TRACT | 3 refills | Status: AC

## 2023-08-15 NOTE — Patient Instructions (Signed)
 Full pft performed today.

## 2023-08-15 NOTE — Progress Notes (Signed)
 Todd Payne    [id number]    1960/10/21  Primary Care Physician:Cummings, Severa Norris, PA-C (Inactive)  Referring Physician: Neda Jennet LABOR, MD [address] [place],  [place] [address]  Chief complaint:   History of obstructive sleep apnea Chronic cough  HPI:  Overall cough is better  Mild obstructive sleep apnea - Still waiting for machine  His breathing has been relatively stable  History of reflux Nasal stuffiness and congestion Sensitivity to pollen  Weight has remained stable  Accompanied by spouse-her questions were answered during the visit  Snoring, daytime sleepiness Was compliant with CPAP previously  Outpatient Encounter Medications as of 08/15/2023  Medication Sig   amLODipine  (NORVASC ) 10 MG tablet Take 10 mg by mouth daily.   atorvastatin (LIPITOR) 20 MG tablet Take 20 mg by mouth daily.   chlorthalidone (HYGROTON) 25 MG tablet Take 25 mg by mouth daily.   Cholecalciferol (D3 PO) Take by mouth.   diclofenac  sodium (VOLTAREN ) 1 % GEL Apply 2 g topically 4 (four) times daily. To affected joint.   famotidine (PEPCID) 20 MG tablet Take 20 mg by mouth daily.   fluticasone  (FLONASE ) 50 MCG/ACT nasal spray Place 2 sprays into both nostrils daily.   loratadine (CLARITIN) 10 MG tablet Take 10 mg by mouth daily.   losartan  (COZAAR ) 100 MG tablet TAKE 1 TABLET BY MOUTH EVERY DAY   metformin (FORTAMET) 500 MG (OSM) 24 hr tablet Take 500 mg by mouth daily with breakfast.   sildenafil (VIAGRA) 50 MG tablet Take 50 mg by mouth as needed.   albuterol  (PROVENTIL  HFA;VENTOLIN  HFA) 108 (90 Base) MCG/ACT inhaler Inhale 1-2 puffs into the lungs every 4 (four) hours as needed for wheezing or shortness of breath (bronchospasm). (Patient not taking: Reported on 08/15/2023)   No facility-administered encounter medications on file as of 08/15/2023.    Allergies as of 08/15/2023 - Review Complete 08/15/2023  Allergen Reaction Noted   Ace  inhibitors Cough 08/25/2016   Benzonatate  Hives 08/25/2016    Past Medical History:  Diagnosis Date   Alcohol abuse    Community acquired pneumonia of left lung 08/25/2016   GERD (gastroesophageal reflux disease)    Hyperlipidemia    Hypertension    Microcytic anemia 01/14/2017   OSA (obstructive sleep apnea)    Osteoarthritis of right shoulder    Pneumonia 08/2016   Prediabetes    PTSD (post-traumatic stress disorder)     Past Surgical History:  Procedure Laterality Date   APPENDECTOMY     KNEE SURGERY Bilateral    MENISCUS REPAIR      Family History  Problem Relation Age of Onset   Diabetes Mother    Colon cancer Neg Hx    Prostate cancer Neg Hx     Social History   Socioeconomic History   Marital status: Married    Spouse name: Not on file   Number of children: 4   Years of education: Not on file   Highest education level: Not on file  Occupational History   Not on file  Tobacco Use   Smoking status: Some Days    Types: Cigars   Smokeless tobacco: Never   Tobacco comments:    1 cigar a month 09/09/29 ARJ   Vaping Use   Vaping status: Never Used  Substance and Sexual Activity   Alcohol use: Yes    Alcohol/week: 42.0 standard drinks of alcohol    Types: 21 Cans of  beer, 21 Shots of liquor per week    Comment: Occasional   Drug use: No   Sexual activity: Yes  Other Topics Concern   Not on file  Social History Narrative   Not on file   Social Drivers of Health   Financial Resource Strain: Not on file  Food Insecurity: Not on file  Transportation Needs: Not on file  Physical Activity: Not on file  Stress: Not on file  Social Connections: Not on file  Intimate Partner Violence: Not on file    Review of Systems  Respiratory:  Positive for apnea and cough.   Psychiatric/Behavioral:  Positive for sleep disturbance.     Vitals:   08/15/23 1105  BP: 119/76  Pulse: 84  SpO2: 96%     Physical Exam Constitutional:      Appearance: He is  obese.  HENT:     Head: Normocephalic.     Nose: Nose normal.     Mouth/Throat:     Mouth: Mucous membranes are moist.  Eyes:     General: No scleral icterus. Cardiovascular:     Rate and Rhythm: Normal rate and regular rhythm.     Heart sounds: No murmur heard.    No friction rub.  Pulmonary:     Effort: No respiratory distress.     Breath sounds: No stridor. No wheezing or rhonchi.  Musculoskeletal:     Cervical back: No rigidity or tenderness.  Neurological:     Mental Status: He is alert.  Psychiatric:        Mood and Affect: Mood normal.    Data Reviewed: Sleep study reviewed showing it was titrated to CPAP of 16, AutoPap recommended 10-20  His initial sleep study was in the [facility], [place], 2019  Pulmonary function test was reviewed with him in the office today showing mild obstructive disease with significant bronchodilator response of 12% and mild restriction, normal diffusing capacity  Assessment:  Obstructive sleep apnea -Waiting for his machine  Abnormal PFT with mild obstruction with mild restriction - Significant response to bronchodilator - Did discuss use of inhalers - Prescription for albuterol  and Breo will be placed  Avoid known triggers   Plan/Recommendations:  Prescription for albuterol  and Breo provided  Initiate CPAP once patient is received  Graded activities as tolerated  Follow-up in 6 months  Encouraged to call with significant concerns   Jennet Epley MD [facility] 08/15/2023, 11:11 AM  CC: Epley Jennet LABOR, MD

## 2023-08-15 NOTE — Progress Notes (Signed)
 Full pft performed today.

## 2023-08-15 NOTE — Patient Instructions (Signed)
 Your breathing study did showed narrowing in the breathing passages that responded well to inhalers  I will send in a prescription for albuterol  Will send in a prescription for maintenance inhaler-it is a steroid and long-acting albuterol  to help maintain the airways as open as we can get them  Continue graded activities  Start using your CPAP as soon as you get the machine  Follow-up in about 6 months

## 2023-08-16 ENCOUNTER — Telehealth: Payer: Self-pay

## 2023-08-16 ENCOUNTER — Other Ambulatory Visit: Payer: Self-pay

## 2023-08-16 MED ORDER — FLUTICASONE-SALMETEROL 250-50 MCG/ACT IN AEPB: 1.0000 | INHALATION_SPRAY | Freq: Two times a day (BID) | RESPIRATORY_TRACT | 11 refills | Status: AC

## 2023-08-16 NOTE — Telephone Encounter (Signed)
 Pharmacist in call stated that Advair is preferred. Rx for Advair sent to [facility], PharmD, MPH, BCPS, CPP Clinical Pharmacist (Rheumatology and Pulmonology)

## 2023-08-16 NOTE — Telephone Encounter (Signed)
 Copied from CRM #[id number]. Topic: Clinical - Prescription Issue >> Aug 16, 2023  8:10 AM Russell PARAS wrote: Reason for CRM:   Selinda, with [facility] in [place] is contacting clinic to speak with nurse concerning pt's prescribed Breo Ellipta . This medication is not in the pt's formulary; however Advair is.   Pharmacist requesting prescription be changed to Advair and resent to pharmacy  CB#  [phone], ext [phone]

## 2023-08-17 NOTE — Telephone Encounter (Signed)
 Pt was seen in clinic 08/15/23 and this was addressed at that time.

## 2023-09-05 ENCOUNTER — Other Ambulatory Visit: Payer: Self-pay | Admitting: Pulmonary Disease

## 2023-10-05 ENCOUNTER — Emergency Department
Admission: EM | Admit: 2023-10-05 | Discharge: 2023-10-05 | Disposition: A | Discharge status: Home / Self Care | Attending: Emergency Medicine | Admitting: Emergency Medicine

## 2023-10-05 ENCOUNTER — Other Ambulatory Visit: Payer: Self-pay

## 2023-10-05 ENCOUNTER — Encounter: Payer: Self-pay | Admitting: Emergency Medicine

## 2023-10-05 ENCOUNTER — Emergency Department

## 2023-10-05 DIAGNOSIS — Y92410 Unspecified street and highway as the place of occurrence of the external cause: Secondary | ICD-10-CM | POA: Diagnosis not present

## 2023-10-05 DIAGNOSIS — I10 Essential (primary) hypertension: Secondary | ICD-10-CM | POA: Diagnosis not present

## 2023-10-05 DIAGNOSIS — S3992XA Unspecified injury of lower back, initial encounter: Secondary | ICD-10-CM | POA: Diagnosis present

## 2023-10-05 DIAGNOSIS — S39012A Strain of muscle, fascia and tendon of lower back, initial encounter: Secondary | ICD-10-CM | POA: Diagnosis not present

## 2023-10-05 DIAGNOSIS — E119 Type 2 diabetes mellitus without complications: Secondary | ICD-10-CM | POA: Insufficient documentation

## 2023-10-05 MED ORDER — KETOROLAC TROMETHAMINE 30 MG/ML IJ SOLN
30.0000 mg | Freq: Once | INTRAMUSCULAR | Status: AC
  Administered 2023-10-05: 30 mg via INTRAMUSCULAR
  Filled 2023-10-05: qty 1

## 2023-10-05 MED ORDER — CYCLOBENZAPRINE HCL 5 MG PO TABS: 5.0000 mg | ORAL_TABLET | Freq: Three times a day (TID) | ORAL | 0 refills | Status: AC | PRN

## 2023-10-05 MED ORDER — CYCLOBENZAPRINE HCL 10 MG PO TABS
5.0000 mg | ORAL_TABLET | Freq: Once | ORAL | Status: AC
  Administered 2023-10-05: 5 mg via ORAL
  Filled 2023-10-05: qty 1

## 2023-10-05 MED ORDER — LIDOCAINE 5 % EX PTCH
2.0000 | MEDICATED_PATCH | CUTANEOUS | Status: DC
  Administered 2023-10-05: 2 via TRANSDERMAL
  Filled 2023-10-05: qty 2

## 2023-10-05 NOTE — ED Notes (Signed)
 Pt completely assessed by EDP and set to discharge. No RN assessment performed.   Pt teaching provided on medications that may cause drowsiness. Pt instructed not to drive or operate heavy machinery while taking the prescribed medication. Pt verbalized understanding.   Pt provided discharge instructions and prescription information. Pt was given the opportunity to ask questions and questions were answered.
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
## 2023-10-09 ENCOUNTER — Ambulatory Visit: Payer: Self-pay | Admitting: Pulmonary Disease
